# Patient Record
Sex: Male | Born: 1965 | Race: Black or African American | Hispanic: No | Marital: Single | Smoking: Never smoker
Health system: Southern US, Community
[De-identification: ages and names within clinical notes are randomized; demographics above are authoritative.]

## PROBLEM LIST (undated history)

## (undated) DIAGNOSIS — K649 Unspecified hemorrhoids: Secondary | ICD-10-CM

## (undated) DIAGNOSIS — I10 Essential (primary) hypertension: Secondary | ICD-10-CM

## (undated) HISTORY — PX: BACK SURGERY: SHX140

## (undated) HISTORY — PX: KNEE SURGERY: SHX244

---

## 2014-06-27 DIAGNOSIS — M5137 Other intervertebral disc degeneration, lumbosacral region: Secondary | ICD-10-CM | POA: Insufficient documentation

## 2014-09-22 DIAGNOSIS — I10 Essential (primary) hypertension: Secondary | ICD-10-CM | POA: Insufficient documentation

## 2014-09-22 DIAGNOSIS — E7800 Pure hypercholesterolemia, unspecified: Secondary | ICD-10-CM | POA: Insufficient documentation

## 2014-09-22 DIAGNOSIS — R7303 Prediabetes: Secondary | ICD-10-CM | POA: Insufficient documentation

## 2014-10-31 DIAGNOSIS — G8929 Other chronic pain: Secondary | ICD-10-CM | POA: Insufficient documentation

## 2014-10-31 DIAGNOSIS — M545 Low back pain: Secondary | ICD-10-CM

## 2014-11-01 DIAGNOSIS — M961 Postlaminectomy syndrome, not elsewhere classified: Secondary | ICD-10-CM | POA: Insufficient documentation

## 2014-12-05 DIAGNOSIS — M47816 Spondylosis without myelopathy or radiculopathy, lumbar region: Secondary | ICD-10-CM | POA: Insufficient documentation

## 2015-01-26 DIAGNOSIS — G4730 Sleep apnea, unspecified: Secondary | ICD-10-CM | POA: Insufficient documentation

## 2015-01-26 DIAGNOSIS — E669 Obesity, unspecified: Secondary | ICD-10-CM | POA: Insufficient documentation

## 2015-11-19 ENCOUNTER — Emergency Department
Admission: EM | Admit: 2015-11-19 | Discharge: 2015-11-19 | Disposition: A | Discharge status: Home / Self Care | Payer: Managed Care, Other (non HMO) | Attending: Emergency Medicine | Admitting: Emergency Medicine

## 2015-11-19 DIAGNOSIS — K6289 Other specified diseases of anus and rectum: Secondary | ICD-10-CM | POA: Diagnosis not present

## 2015-11-19 DIAGNOSIS — K649 Unspecified hemorrhoids: Secondary | ICD-10-CM | POA: Diagnosis not present

## 2015-11-19 LAB — CBC
HCT: 39.5 % — ABNORMAL LOW (ref 40.0–52.0)
HEMOGLOBIN: 13 g/dL (ref 13.0–18.0)
MCH: 26.7 pg (ref 26.0–34.0)
MCHC: 32.8 g/dL (ref 32.0–36.0)
MCV: 81.5 fL (ref 80.0–100.0)
PLATELETS: 275 10*3/uL (ref 150–440)
RBC: 4.85 MIL/uL (ref 4.40–5.90)
RDW: 15 % — AB (ref 11.5–14.5)
WBC: 6.2 10*3/uL (ref 3.8–10.6)

## 2015-11-19 LAB — COMPREHENSIVE METABOLIC PANEL
ALK PHOS: 83 U/L (ref 38–126)
ALT: 25 U/L (ref 17–63)
ANION GAP: 5 (ref 5–15)
AST: 18 U/L (ref 15–41)
Albumin: 4.6 g/dL (ref 3.5–5.0)
BUN: 18 mg/dL (ref 6–20)
CALCIUM: 9.2 mg/dL (ref 8.9–10.3)
CO2: 28 mmol/L (ref 22–32)
Chloride: 104 mmol/L (ref 101–111)
Creatinine, Ser: 1.17 mg/dL (ref 0.61–1.24)
GLUCOSE: 89 mg/dL (ref 65–99)
Potassium: 3.8 mmol/L (ref 3.5–5.1)
Sodium: 137 mmol/L (ref 135–145)
TOTAL PROTEIN: 7.8 g/dL (ref 6.5–8.1)
Total Bilirubin: 0.2 mg/dL — ABNORMAL LOW (ref 0.3–1.2)

## 2015-11-19 MED ORDER — DOCUSATE SODIUM 100 MG PO CAPS: 100.0000 mg | ORAL_CAPSULE | Freq: Every day | ORAL | Status: DC | PRN

## 2015-11-19 MED ORDER — OXYCODONE-ACETAMINOPHEN 5-325 MG PO TABS: 2.0000 | ORAL_TABLET | Freq: Four times a day (QID) | ORAL | Status: DC | PRN

## 2015-11-19 MED ORDER — HYDROCORTISONE ACETATE 25 MG RE SUPP: 25.0000 mg | Freq: Two times a day (BID) | RECTAL | Status: DC

## 2015-11-19 NOTE — ED Notes (Signed)
NAD noted at time of D/C. Pt denies questions or concerns. Pt ambulatory to the lobby at this time.  

## 2015-11-19 NOTE — ED Provider Notes (Signed)
[facility] Emergency Department Provider Note     Time seen: ----------------------------------------- 8:56 PM on 11/19/2015 -----------------------------------------    I have reviewed the triage vital signs and the nursing notes.   HISTORY  Chief Complaint Hemorrhoids    HPI Rodney Garrison is a 50 y.o. male who presents ER for hemorrhoidal pain for the last 5 days. Patient states she's been having rectal pain, every time he has a bowel movement he has bleeding and pain. Patient reports she is concerned because bleeding and pain is not getting any better. He's had a gastroenterologist in the past and he has a colonoscopy scheduled a month from now. He has never used any suppositories or rectal creams.   No past medical history on file.  There are no active problems to display for this patient.   No past surgical history on file.  Allergies Review of patient's allergies indicates no known allergies.  Social History Social History  Substance Use Topics  . Smoking status: Not on file  . Smokeless tobacco: Not on file  . Alcohol Use: Not on file    Review of Systems Constitutional: Negative for fever. Gastrointestinal: Negative for abdominal pain, vomiting and diarrhea. Positive for rectal pain, bleeding Skin: Negative for rash. Neurological: Negative for headaches, focal weakness or numbness.  10-point ROS otherwise negative.  ____________________________________________   PHYSICAL EXAM:  VITAL SIGNS: ED Triage Vitals  Enc Vitals Group     BP 11/19/15 1947 140/82 mmHg     Pulse Rate 11/19/15 1947 63     Resp 11/19/15 1947 18     Temp 11/19/15 1947 98.6 F (37 C)     Temp Source 11/19/15 1947 Oral     SpO2 11/19/15 1947 99 %     Weight 11/19/15 1947 265 lb (120.203 kg)     Height 11/19/15 1947  (1.93 m)     Head Cir --      Peak Flow --      Pain Score 11/19/15 1948 6     Pain Loc --      Pain Edu? --      Excl. in GC?  --    Constitutional: Alert and oriented. Well appearing and in no distress. Gastrointestinal: Soft and nontender.  Rectal: Normal external appearance, no external hemorrhoids. A visible blood. Musculoskeletal: Nontender with normal range of motion in all extremities.  Neurologic:  Normal speech and language. No gross focal neurologic deficits are appreciated.  Skin:  Skin is warm, dry and intact. No rash noted. Psychiatric: Mood and affect are normal. Speech and behavior are normal.  ____________________________________________  ED COURSE:  Pertinent labs & imaging results that were available during my care of the patient were reviewed by me and considered in my medical decision making (see chart for details). Patient is in no acute distress, is having rectal pain of uncertain etiology. ____________________________________________    LABS (pertinent positives/negatives)  Labs Reviewed  COMPREHENSIVE METABOLIC PANEL - Abnormal; Notable for the following:    Total Bilirubin 0.2 (*)    All other components within normal limits  CBC - Abnormal; Notable for the following:    HCT 39.5 (*)    RDW 15.0 (*)    All other components within normal limits  ____________________________________________  FINAL ASSESSMENT AND PLAN  Hemorrhoidal pain  Plan: Patient with labs as dictated above. Patient is likely having an intermittent prolapsed internal hemorrhoid with pain and bleeding. None is visualized at this time. He'll be placed  on Anusol suppositories, pain medicine and stool softeners. He is encouraged to have close follow-up with gastroenterology.   Emily FilbertWilliams, Jonathan E, MD   Emily FilbertJonathan E Williams, MD 11/19/15 [phone]

## 2015-11-19 NOTE — ED Notes (Signed)
Pt reports he has internal hemorrhoids and reports about 4 days ago after having a BM he developed bleeding and pain. Pt reports he is concerned because the bleeding and pain is not getting any better. Pt talking in full and complete sentence with no difficulty.

## 2015-11-19 NOTE — Discharge Instructions (Signed)
Hemorrhoids °Hemorrhoids are swollen veins around the rectum or anus. There are two types of hemorrhoids:  °· Internal hemorrhoids. These occur in the veins just inside the rectum. They may poke through to the outside and become irritated and painful. °· External hemorrhoids. These occur in the veins outside the anus and can be felt as a painful swelling or hard lump near the anus. °CAUSES °· Pregnancy.   °· Obesity.   °· Constipation or diarrhea.   °· Straining to have a bowel movement.   °· Sitting for long periods on the toilet. °· Heavy lifting or other activity that caused you to strain. °· Anal intercourse. °SYMPTOMS  °· Pain.   °· Anal itching or irritation.   °· Rectal bleeding.   °· Fecal leakage.   °· Anal swelling.   °· One or more lumps around the anus.   °DIAGNOSIS  °Your caregiver may be able to diagnose hemorrhoids by visual examination. Other examinations or tests that may be performed include:  °· Examination of the rectal area with a gloved hand (digital rectal exam).   °· Examination of anal canal using a small tube (scope).   °· A blood test if you have lost a significant amount of blood. °· A test to look inside the colon (sigmoidoscopy or colonoscopy). °TREATMENT °Most hemorrhoids can be treated at home. However, if symptoms do not seem to be getting better or if you have a lot of rectal bleeding, your caregiver may perform a procedure to help make the hemorrhoids get smaller or remove them completely. Possible treatments include:  °· Placing a rubber band at the base of the hemorrhoid to cut off the circulation (rubber band ligation).   °· Injecting a chemical to shrink the hemorrhoid (sclerotherapy).   °· Using a tool to burn the hemorrhoid (infrared light therapy).   °· Surgically removing the hemorrhoid (hemorrhoidectomy).   °· Stapling the hemorrhoid to block blood flow to the tissue (hemorrhoid stapling).   °HOME CARE INSTRUCTIONS  °· Eat foods with fiber, such as whole grains, beans,  nuts, fruits, and vegetables. Ask your doctor about taking products with added fiber in them (fiber supplements). °· Increase fluid intake. Drink enough water and fluids to keep your urine clear or pale yellow.   °· Exercise regularly.   °· Go to the bathroom when you have the urge to have a bowel movement. Do not wait.   °· Avoid straining to have bowel movements.   °· Keep the anal area dry and clean. Use wet toilet paper or moist towelettes after a bowel movement.   °· Medicated creams and suppositories may be used or applied as directed.   °· Only take over-the-counter or prescription medicines as directed by your caregiver.   °· Take warm sitz baths for 15-20 minutes, 3-4 times a day to ease pain and discomfort.   °· Place ice packs on the hemorrhoids if they are tender and swollen. Using ice packs between sitz baths may be helpful.   °· Put ice in a plastic bag.   °· Place a towel between your skin and the bag.   °· Leave the ice on for 15-20 minutes, 3-4 times a day.   °· Do not use a donut-shaped pillow or sit on the toilet for long periods. This increases blood pooling and pain.   °SEEK MEDICAL CARE IF: °· You have increasing pain and swelling that is not controlled by treatment or medicine. °· You have uncontrolled bleeding. °· You have difficulty or you are unable to have a bowel movement. °· You have pain or inflammation outside the area of the hemorrhoids. °MAKE SURE YOU: °· Understand these instructions. °·   Will watch your condition.  Will get help right away if you are not doing well or get worse.   This information is not intended to replace advice given to you by your health care provider. Make sure you discuss any questions you have with your health care provider.   Document Released: 07/26/2000 Document Revised: 07/15/2012 Document Reviewed: 06/02/2012 Elsevier Interactive Patient Education 2016 Elsevier Inc.  Proctitis Proctitis is swelling and soreness (inflammation) of the lining of  the rectum. The rectum is at the end of the large intestine, and it leads to the anus. The inflammation causes pain and discomfort. It may be a short-term (acute) or long-lasting (chronic) problem. CAUSES This condition may be caused by:  STDs (sexually transmitted diseases).  Infection.  Trauma or injury to the anus or rectum.  Ulcerative colitis or Crohn disease.  Radiation therapy that is directed near the rectum.  Antibiotic therapy. SYMPTOMS Symptoms of this condition include:  Sudden, uncomfortable, and frequent urge to have a bowel movement.  Anal pain or rectal pain.  Pain or cramping in the abdomen.  Sensation that the rectum is full.  Rectal bleeding.  Pus or mucus discharge from the anus.  Diarrhea or frequent soft, loose stools.  Constipation.  Pain with bowel movements. DIAGNOSIS This condition may be diagnosed based on:  A medical history and physical exam.  Various tests, such as:  An STD test.  Blood tests.  Stool tests.  Rectal culture.  A procedure to evaluate the anal canal (anoscopy).  Procedures to look at the entire large bowel or part of it (colonoscopy or sigmoidoscopy). TREATMENT Treatment for this condition depends on the cause. The main goals of treatment are to reduce the symptoms of inflammation and to get rid of any infection. Treatment may include:  Home remedies and lifestyle changes, such as sitz baths and avoiding food right before bedtime.  Medicines, such as:  Topical ointments, foams, suppositories, or enemas, such as corticosteroids or anti-inflammatories.  Antibiotic or antiviral medicines to treat infection or to control harmful bacteria.  Medicines to control diarrhea, soften stools, and reduce pain.  Medicines to suppress the immune system.  Nutritional, dietary, or herbal supplements.  Avoiding the activity that caused rectal trauma.  Heat or laser therapy for persistent bleeding.  A dilation  procedure to enlarge a narrowed rectum.  Surgery to repair damaged rectal lining. This is rare. If your proctitis was caused by an STD, your health care provider may test you for infection again 3 months after treatment. HOME CARE INSTRUCTIONS  Take over-the-counter and prescription medicines only as told by your health care provider.  If you were prescribed an antibiotic medicine, take it as told by your health care provider. Do not stop taking the antibiotic even if you start to feel better.  Try to avoid eating right before bedtime.  Take sitz baths as told by your health care provider.  Keep all follow-up visits as told by your health care provider. This is important. SEEK MEDICAL CARE IF:  Your symptoms do not improve with treatment.  Your symptoms get worse.  You have a fever.   This information is not intended to replace advice given to you by your health care provider. Make sure you discuss any questions you have with your health care provider.   Document Released: 07/18/2011 Document Revised: 04/19/2015 Document Reviewed: 10/24/2014 Elsevier Interactive Patient Education [employer]2016 Elsevier Inc.

## 2016-02-18 ENCOUNTER — Encounter: Payer: Self-pay | Admitting: Emergency Medicine

## 2016-02-18 ENCOUNTER — Emergency Department
Admission: EM | Admit: 2016-02-18 | Discharge: 2016-02-18 | Disposition: A | Discharge status: Home / Self Care | Payer: Managed Care, Other (non HMO) | Attending: Emergency Medicine | Admitting: Emergency Medicine

## 2016-02-18 DIAGNOSIS — I10 Essential (primary) hypertension: Secondary | ICD-10-CM | POA: Diagnosis not present

## 2016-02-18 DIAGNOSIS — K902 Blind loop syndrome, not elsewhere classified: Secondary | ICD-10-CM | POA: Insufficient documentation

## 2016-02-18 DIAGNOSIS — K644 Residual hemorrhoidal skin tags: Secondary | ICD-10-CM | POA: Insufficient documentation

## 2016-02-18 DIAGNOSIS — K602 Anal fissure, unspecified: Secondary | ICD-10-CM

## 2016-02-18 DIAGNOSIS — K6289 Other specified diseases of anus and rectum: Secondary | ICD-10-CM | POA: Diagnosis present

## 2016-02-18 HISTORY — DX: Essential (primary) hypertension: I10

## 2016-02-18 HISTORY — DX: Unspecified hemorrhoids: K64.9

## 2016-02-18 LAB — COMPREHENSIVE METABOLIC PANEL
ALBUMIN: 5.1 g/dL — AB (ref 3.5–5.0)
ALK PHOS: 92 U/L (ref 38–126)
ALT: 26 U/L (ref 17–63)
ANION GAP: 8 (ref 5–15)
AST: 19 U/L (ref 15–41)
BILIRUBIN TOTAL: 0.8 mg/dL (ref 0.3–1.2)
BUN: 13 mg/dL (ref 6–20)
CALCIUM: 9.7 mg/dL (ref 8.9–10.3)
CO2: 31 mmol/L (ref 22–32)
Chloride: 97 mmol/L — ABNORMAL LOW (ref 101–111)
Creatinine, Ser: 1.3 mg/dL — ABNORMAL HIGH (ref 0.61–1.24)
GFR calc Af Amer: >60 mL/min (ref >60)
GLUCOSE: 109 mg/dL — AB (ref 65–99)
Potassium: 3.5 mmol/L (ref 3.5–5.1)
Sodium: 136 mmol/L (ref 135–145)
TOTAL PROTEIN: 8.7 g/dL — AB (ref 6.5–8.1)

## 2016-02-18 LAB — CBC
HCT: 43.5 % (ref 40.0–52.0)
HEMOGLOBIN: 14.7 g/dL (ref 13.0–18.0)
MCH: 27.2 pg (ref 26.0–34.0)
MCHC: 33.8 g/dL (ref 32.0–36.0)
MCV: 80.3 fL (ref 80.0–100.0)
Platelets: 294 10*3/uL (ref 150–440)
RBC: 5.41 MIL/uL (ref 4.40–5.90)
RDW: 14.5 % (ref 11.5–14.5)
WBC: 7.2 10*3/uL (ref 3.8–10.6)

## 2016-02-18 MED ORDER — SENNA 8.6 MG PO TABS: 2.0000 | ORAL_TABLET | Freq: Two times a day (BID) | ORAL | Status: DC

## 2016-02-18 MED ORDER — DOCUSATE SODIUM 100 MG PO CAPS: 200.0000 mg | ORAL_CAPSULE | Freq: Two times a day (BID) | ORAL | Status: DC

## 2016-02-18 MED ORDER — PRAMOXINE HCL 1 % RE FOAM: 1.0000 "application " | Freq: Three times a day (TID) | RECTAL | Status: DC | PRN

## 2016-02-18 MED ORDER — POLYETHYLENE GLYCOL 3350 17 GM/SCOOP PO POWD: ORAL | Status: DC

## 2016-02-18 MED ORDER — TUCKS 50 % EX PADS: 1.0000 "application " | MEDICATED_PAD | CUTANEOUS | Status: DC | PRN

## 2016-02-18 NOTE — ED Notes (Signed)
Patient reports HX of hemorrhoids.  Patient reports whenever he has a BM, he has worsening pain in his rectum. The pain is constantly in his rectum x1 week

## 2016-02-18 NOTE — ED Notes (Signed)
Scheduled for colonoscopy for next Monday.

## 2016-02-18 NOTE — ED Notes (Signed)
Pt states rectal pain started 3 months ago, but increased pain started 3 days ago.  Pt states it feels like it is burning and aching in rectum.  Hard to have a BM states it feels like laser sharp pain.  Bright red stool.  Pt does have a hx of hemorrhoids in the past. Pt states it is difficulty to have BM without straining.

## 2016-02-18 NOTE — Discharge Instructions (Signed)
Anal Fissure, Adult An anal fissure is a small tear or crack in the skin around the anus. Bleeding from a fissure usually stops on its own within a few minutes. However, bleeding will often occur again with each bowel movement until the crack heals. CAUSES This condition may be caused by:  Passing large, hard stool (feces).  Frequent diarrhea.  Constipation.  Inflammatory bowel disease (Crohn disease or ulcerative colitis).  Infections.  Anal sex. SYMPTOMS Symptoms of this condition include:  Bleeding from the rectum.  Small amounts of blood seen on your stool, on toilet paper, or in the toilet after a bowel movement.  Painful bowel movements.  Itching or irritation around the anus. DIAGNOSIS A health care provider may diagnose this condition by closely examining the anal area. An anal fissure can usually be seen with careful inspection. In some cases, a rectal exam may be performed, or a short tube (anoscope) may be used to examine the anal canal. TREATMENT Treatment for this condition may include:  Taking steps to avoid constipation. This may include making changes to your diet, such as increasing your intake of fiber or fluid.  Taking fiber supplements. These supplements can soften your stool to help make bowel movements easier. Your health care provider may also prescribe a stool softener if your stool is often hard.  Taking sitz baths. This may help to heal the tear.  Using medicated creams or ointments. These may be prescribed to lessen discomfort. HOME CARE INSTRUCTIONS Eating and Drinking  Avoid foods that may be constipating, such as bananas and dairy products.  Drink enough fluid to keep your urine clear or pale yellow.  Maintain a diet that is high in fruits, whole grains, and vegetables. General Instructions  Keep the anal area as clean and dry as possible.  Take sitz baths as told by your health care provider. Do not use soap in the sitz baths.  Take  over-the-counter and prescription medicines only as told by your health care provider.  Use creams or ointments only as told by your health care provider.  Keep all follow-up visits as told by your health care provider. This is important. SEEK MEDICAL CARE IF:  You have more bleeding.  You have a fever.  You have diarrhea that is mixed with blood.  You continue to have pain.  Your problem is getting worse rather than better.   This information is not intended to replace advice given to you by your health care provider. Make sure you discuss any questions you have with your health care provider.   Document Released: 07/29/2005 Document Revised: 04/19/2015 Document Reviewed: 10/24/2014 Elsevier Interactive Patient Education 2016 [employer].  Hemorrhoids Hemorrhoids are swollen veins around the rectum or anus. There are two types of hemorrhoids:   Internal hemorrhoids. These occur in the veins just inside the rectum. They may poke through to the outside and become irritated and painful.  External hemorrhoids. These occur in the veins outside the anus and can be felt as a painful swelling or hard lump near the anus. CAUSES  Pregnancy.   Obesity.   Constipation or diarrhea.   Straining to have a bowel movement.   Sitting for long periods on the toilet.  Heavy lifting or other activity that caused you to strain.  Anal intercourse. SYMPTOMS   Pain.   Anal itching or irritation.   Rectal bleeding.   Fecal leakage.   Anal swelling.   One or more lumps around the anus.  DIAGNOSIS  Your caregiver may be able to diagnose hemorrhoids by visual examination. Other examinations or tests that may be performed include:   Examination of the rectal area with a gloved hand (digital rectal exam).   Examination of anal canal using a small tube (scope).   A blood test if you have lost a significant amount of blood.  A test to look inside the colon  (sigmoidoscopy or colonoscopy). TREATMENT Most hemorrhoids can be treated at home. However, if symptoms do not seem to be getting better or if you have a lot of rectal bleeding, your caregiver may perform a procedure to help make the hemorrhoids get smaller or remove them completely. Possible treatments include:   Placing a rubber band at the base of the hemorrhoid to cut off the circulation (rubber band ligation).   Injecting a chemical to shrink the hemorrhoid (sclerotherapy).   Using a tool to burn the hemorrhoid (infrared light therapy).   Surgically removing the hemorrhoid (hemorrhoidectomy).   Stapling the hemorrhoid to block blood flow to the tissue (hemorrhoid stapling).  HOME CARE INSTRUCTIONS   Eat foods with fiber, such as whole grains, beans, nuts, fruits, and vegetables. Ask your doctor about taking products with added fiber in them (fibersupplements).  Increase fluid intake. Drink enough water and fluids to keep your urine clear or pale yellow.   Exercise regularly.   Go to the bathroom when you have the urge to have a bowel movement. Do not wait.   Avoid straining to have bowel movements.   Keep the anal area dry and clean. Use wet toilet paper or moist towelettes after a bowel movement.   Medicated creams and suppositories may be used or applied as directed.   Only take over-the-counter or prescription medicines as directed by your caregiver.   Take warm sitz baths for 15-20 minutes, 3-4 times a day to ease pain and discomfort.   Place ice packs on the hemorrhoids if they are tender and swollen. Using ice packs between sitz baths may be helpful.   Put ice in a plastic bag.   Place a towel between your skin and the bag.   Leave the ice on for 15-20 minutes, 3-4 times a day.   Do not use a donut-shaped pillow or sit on the toilet for long periods. This increases blood pooling and pain.  SEEK MEDICAL CARE IF:  You have increasing pain and  swelling that is not controlled by treatment or medicine.  You have uncontrolled bleeding.  You have difficulty or you are unable to have a bowel movement.  You have pain or inflammation outside the area of the hemorrhoids. MAKE SURE YOU:  Understand these instructions.  Will watch your condition.  Will get help right away if you are not doing well or get worse.   This information is not intended to replace advice given to you by your health care provider. Make sure you discuss any questions you have with your health care provider.   Document Released: 07/26/2000 Document Revised: 07/15/2012 Document Reviewed: 06/02/2012 Elsevier Interactive Patient Education 2016 Elsevier Inc.  High-Fiber Diet Fiber, also called dietary fiber, is a type of carbohydrate found in fruits, vegetables, whole grains, and beans. A high-fiber diet can have many health benefits. Your health care provider may recommend a high-fiber diet to help:  Prevent constipation. Fiber can make your bowel movements more regular.  Lower your cholesterol.  Relieve hemorrhoids, uncomplicated diverticulosis, or irritable bowel syndrome.  Prevent overeating as part of a weight-loss  plan.  Prevent heart disease, type 2 diabetes, and certain cancers. WHAT IS MY PLAN? The recommended daily intake of fiber includes:  38 grams for men under age 48.  30 grams for men over age 48.  25 grams for women under age 48.  21 grams for women over age 48. You can get the recommended daily intake of dietary fiber by eating a variety of fruits, vegetables, grains, and beans. Your health care provider may also recommend a fiber supplement if it is not possible to get enough fiber through your diet. WHAT DO I NEED TO KNOW ABOUT A HIGH-FIBER DIET?  Fiber supplements have not been widely studied for their effectiveness, so it is better to get fiber through food sources.  Always check the fiber content on thenutrition facts label of  any prepackaged food. Look for foods that contain at least 5 grams of fiber per serving.  Ask your dietitian if you have questions about specific foods that are related to your condition, especially if those foods are not listed in the following section.  Increase your daily fiber consumption gradually. Increasing your intake of dietary fiber too quickly may cause bloating, cramping, or gas.  Drink plenty of water. Water helps you to digest fiber. WHAT FOODS CAN I EAT? Grains Whole-grain breads. Multigrain cereal. Oats and oatmeal. Brown rice. Barley. Bulgur wheat. Millet. Bran muffins. Popcorn. Rye wafer crackers. Vegetables Sweet potatoes. Spinach. Kale. Artichokes. Cabbage. Broccoli. Green peas. Carrots. Squash. Fruits Berries. Pears. Apples. Oranges. Avocados. Prunes and raisins. Dried figs. Meats and Other Protein Sources Navy, kidney, pinto, and soy beans. Split peas. Lentils. Nuts and seeds. Dairy Fiber-fortified yogurt. Beverages Fiber-fortified soy milk. Fiber-fortified orange juice. Other Fiber bars. The items listed above may not be a complete list of recommended foods or beverages. Contact your dietitian for more options. WHAT FOODS ARE NOT RECOMMENDED? Grains White bread. Pasta made with refined flour. White rice. Vegetables Fried potatoes. Canned vegetables. Well-cooked vegetables.  Fruits Fruit juice. Cooked, strained fruit. Meats and Other Protein Sources Fatty cuts of meat. Fried Environmental education officerpoultry or fried fish. Dairy Milk. Yogurt. Cream cheese. Sour cream. Beverages Soft drinks. Other Cakes and pastries. Butter and oils. The items listed above may not be a complete list of foods and beverages to avoid. Contact your dietitian for more information. WHAT ARE SOME TIPS FOR INCLUDING HIGH-FIBER FOODS IN MY DIET?  Eat a wide variety of high-fiber foods.  Make sure that half of all grains consumed each day are whole grains.  Replace breads and cereals made from refined  flour or white flour with whole-grain breads and cereals.  Replace white rice with brown rice, bulgur wheat, or millet.  Start the day with a breakfast that is high in fiber, such as a cereal that contains at least 5 grams of fiber per serving.  Use beans in place of meat in soups, salads, or pasta.  Eat high-fiber snacks, such as berries, raw vegetables, nuts, or popcorn.   This information is not intended to replace advice given to you by your health care provider. Make sure you discuss any questions you have with your health care provider.   Document Released: 07/29/2005 Document Revised: 08/19/2014 Document Reviewed: 01/11/2014 Elsevier Interactive Patient Education [employer] Elsevier Inc.

## 2016-02-18 NOTE — ED Provider Notes (Signed)
[facility] Emergency Department Provider Note  ____________________________________________  Time seen: 4:00 PM  I have reviewed the triage vital signs and the nursing notes.   HISTORY  Chief Complaint Rectal Pain and Nausea    HPI Rodney Garrison is a 50 y.o. male reports a history of hemorrhoids. Chronically he has had very hard stools that are difficult to pass and with a lot of straining. He's also noticed more recently that right before he passes his stool and afterward he feels like he has a sharp stabbing pain in the anus. Sometimes notices a small amount of red blood as well. No vomiting. Eating and drinking normally. Scheduled for colonoscopy in 1 week. Has tried Colace and MiraLAX which seemed to help, but he still has the pain.     Past Medical History  Diagnosis Date  . Hemorrhoid   . Hypertension      There are no active problems to display for this patient.    Past Surgical History  Procedure Laterality Date  . Back surgery    . Knee surgery      right     Current Outpatient Rx  Name  Route  Sig  Dispense  Refill  . docusate sodium (COLACE) 100 MG capsule   Oral   Take 2 capsules (200 mg total) by mouth 2 (two) times daily.   120 capsule   0   . hydrocortisone (ANUSOL-HC) 25 MG suppository   Rectal   Place 1 suppository (25 mg total) rectally 2 (two) times daily.   12 suppository   0   . polyethylene glycol powder (GLYCOLAX/MIRALAX) powder      1 cap full in a full glass of water, two times a day, for 7 days   255 g   0   . pramoxine (PROCTOFOAM) 1 % foam   Rectal   Place 1 application rectally 3 (three) times daily as needed for itching or hemorrhoids.   15 g   0   . senna (SENOKOT) 8.6 MG TABS tablet   Oral   Take 2 tablets (17.2 mg total) by mouth 2 (two) times daily.   120 each   0   . Witch Hazel (TUCKS) 50 % PADS   Apply externally   Apply 1 application topically every 2 (two) hours as needed.   40  each   2      Allergies Review of patient's allergies indicates no known allergies.   History reviewed. No pertinent family history.  Social History Social History  Substance Use Topics  . Smoking status: Never Smoker   . Smokeless tobacco: None  . Alcohol Use: No    Review of Systems  Constitutional:   No fever or chills.  ENT:   No sore throat. No rhinorrhea. Cardiovascular:   No chest pain. Respiratory:   No dyspnea or cough. Gastrointestinal:   Positive rectal pain.  Genitourinary:   Negative for dysuria or difficulty urinating. Musculoskeletal:   Negative for focal pain or swelling Neurological:   Negative for headaches 10-point ROS otherwise negative.  ____________________________________________   PHYSICAL EXAM:  VITAL SIGNS: ED Triage Vitals  Enc Vitals Group     BP 02/18/16 1430 148/87 mmHg     Pulse Rate 02/18/16 1430 97     Resp 02/18/16 1430 18     Temp 02/18/16 1430 97.9 F (36.6 C)     Temp Source 02/18/16 1430 Oral     SpO2 02/18/16 1430 99 %  Weight 02/18/16 1431 260 lb (117.935 kg)     Height 02/18/16 1431  (1.93 m)     Head Cir --      Peak Flow --      Pain Score 02/18/16 1431 9     Pain Loc --      Pain Edu? --      Excl. in GC? --     Vital signs reviewed, nursing assessments reviewed.   Constitutional:   Alert and oriented. Well appearing and in no distress. Gastrointestinal:   Soft and nontender. Non distended. Anal exam reveals a large anal fissure as well as engorged external hemorrhoids. No current bleeding. No prolapse..  Musculoskeletal:   Nontender with normal range of motion in all extremities. No joint effusions.  No lower extremity tenderness.  No edema. Neurologic:   Normal speech and language.  CN 2-10 normal. Motor grossly intact. No gross focal neurologic deficits are appreciated.  Skin:    Skin is warm, dry and intact. No rash noted.  No petechiae, purpura, or  bullae.  ____________________________________________    LABS (pertinent positives/negatives) (all labs ordered are listed, but only abnormal results are displayed) Labs Reviewed  COMPREHENSIVE METABOLIC PANEL - Abnormal; Notable for the following:    Chloride 97 (*)    Glucose, Bld 109 (*)    Creatinine, Ser 1.30 (*)    Total Protein 8.7 (*)    Albumin 5.1 (*)    All other components within normal limits  CBC   ____________________________________________   EKG    ____________________________________________    RADIOLOGY    ____________________________________________   PROCEDURES   ____________________________________________   INITIAL IMPRESSION / ASSESSMENT AND PLAN / ED COURSE  Pertinent labs & imaging results that were available during my care of the patient were reviewed by me and considered in my medical decision making (see chart for details).  Well-appearing no acute distress. Chronic hemorrhoids and now found to have anal fissure related to hard stools and straining. Counseled to do sitz baths, senna Colace and MiraLAX daily. Proctofoam and witch hazel Tucks pads. Follow-up with colonoscopy in a week.Considering the patient's symptoms, medical history, and physical examination today, I have low suspicion for cholecystitis or biliary pathology, pancreatitis, perforation or bowel obstruction, hernia, intra-abdominal abscess, AAA or dissection, volvulus or intussusception, mesenteric ischemia, or appendicitis.       ____________________________________________   FINAL CLINICAL IMPRESSION(S) / ED DIAGNOSES  Final diagnoses:  Inflamed external hemorrhoid  Anal fissure       Portions of this note were generated with dragon dictation software. Dictation errors may occur despite best attempts at proofreading.   Sharman Cheek, MD 02/18/16 [phone]

## 2016-02-26 DIAGNOSIS — K600 Acute anal fissure: Secondary | ICD-10-CM | POA: Insufficient documentation

## 2017-07-16 ENCOUNTER — Other Ambulatory Visit: Payer: Self-pay

## 2017-07-16 ENCOUNTER — Emergency Department
Admission: EM | Admit: 2017-07-16 | Discharge: 2017-07-16 | Disposition: A | Discharge status: Home / Self Care | Payer: Managed Care, Other (non HMO) | Attending: Emergency Medicine | Admitting: Emergency Medicine

## 2017-07-16 ENCOUNTER — Encounter: Payer: Self-pay | Admitting: Emergency Medicine

## 2017-07-16 ENCOUNTER — Emergency Department: Payer: Managed Care, Other (non HMO)

## 2017-07-16 DIAGNOSIS — I10 Essential (primary) hypertension: Secondary | ICD-10-CM | POA: Diagnosis not present

## 2017-07-16 DIAGNOSIS — R0602 Shortness of breath: Secondary | ICD-10-CM

## 2017-07-16 DIAGNOSIS — R5383 Other fatigue: Secondary | ICD-10-CM | POA: Diagnosis not present

## 2017-07-16 DIAGNOSIS — Z79899 Other long term (current) drug therapy: Secondary | ICD-10-CM | POA: Insufficient documentation

## 2017-07-16 LAB — CBC WITH DIFFERENTIAL/PLATELET
Basophils Absolute: 0.1 10*3/uL (ref 0–0.1)
Basophils Relative: 1 %
EOS ABS: 0.2 10*3/uL (ref 0–0.7)
Eosinophils Relative: 2 %
HEMATOCRIT: 43.1 % (ref 40.0–52.0)
HEMOGLOBIN: 14.2 g/dL (ref 13.0–18.0)
LYMPHS ABS: 1.5 10*3/uL (ref 1.0–3.6)
Lymphocytes Relative: 24 %
MCH: 26.7 pg (ref 26.0–34.0)
MCHC: 32.9 g/dL (ref 32.0–36.0)
MCV: 81.2 fL (ref 80.0–100.0)
MONOS PCT: 4 %
Monocytes Absolute: 0.3 10*3/uL (ref 0.2–1.0)
NEUTROS ABS: 4.3 10*3/uL (ref 1.4–6.5)
NEUTROS PCT: 69 %
Platelets: 293 10*3/uL (ref 150–440)
RBC: 5.31 MIL/uL (ref 4.40–5.90)
RDW: 14.3 % (ref 11.5–14.5)
WBC: 6.3 10*3/uL (ref 3.8–10.6)

## 2017-07-16 LAB — COMPREHENSIVE METABOLIC PANEL
ALBUMIN: 4.6 g/dL (ref 3.5–5.0)
ALK PHOS: 95 U/L (ref 38–126)
ALT: 32 U/L (ref 17–63)
AST: 27 U/L (ref 15–41)
Anion gap: 12 (ref 5–15)
BILIRUBIN TOTAL: 0.4 mg/dL (ref 0.3–1.2)
BUN: 14 mg/dL (ref 6–20)
CALCIUM: 9.2 mg/dL (ref 8.9–10.3)
CO2: 26 mmol/L (ref 22–32)
CREATININE: 1.23 mg/dL (ref 0.61–1.24)
Chloride: 98 mmol/L — ABNORMAL LOW (ref 101–111)
GFR calc Af Amer: >60 mL/min (ref >60)
GFR calc non Af Amer: >60 mL/min (ref >60)
GLUCOSE: 118 mg/dL — AB (ref 65–99)
Potassium: 3.7 mmol/L (ref 3.5–5.1)
SODIUM: 136 mmol/L (ref 135–145)
TOTAL PROTEIN: 8.2 g/dL — AB (ref 6.5–8.1)

## 2017-07-16 LAB — TROPONIN I: Troponin I: <0.03 ng/mL (ref <0.03)

## 2017-07-16 NOTE — Discharge Instructions (Signed)
Please seek medical attention for any high fevers, chest pain, shortness of breath, change in behavior, persistent vomiting, bloody stool or any other new or concerning symptoms.  

## 2017-07-16 NOTE — ED Provider Notes (Signed)
[facility] Emergency Department Provider Note   ____________________________________________   I have reviewed the triage vital signs and the nursing notes.   HISTORY  Chief Complaint Fatigue and Shortness of Breath   History limited by: Not Limited   HPI Rodney Garrison is a 51 y.o. male who presents to the emergency department today because of concern for fatigue and shortness of breath.  DURATION:1 month TIMING: constant SEVERITY: severe QUALITY: fatigue, heart racing CONTEXT: patient states that he has been having the symptoms for roughly 1 month. He states that they got worse last night and it woke him out of his sleep. Felt like his heart was racing.  MODIFYING FACTORS: none ASSOCIATED SYMPTOMS: denies any fever  Per medical record review patient has a history of htn.  Past Medical History:  Diagnosis Date  . Hemorrhoid   . Hypertension     There are no active problems to display for this patient.   Past Surgical History:  Procedure Laterality Date  . BACK SURGERY    . KNEE SURGERY     right    Prior to Admission medications   Medication Sig Start Date End Date Taking? Authorizing Provider  docusate sodium (COLACE) 100 MG capsule Take 2 capsules (200 mg total) by mouth 2 (two) times daily. 02/18/16   Sharman CheekStafford, Phillip, MD  hydrocortisone (ANUSOL-HC) 25 MG suppository Place 1 suppository (25 mg total) rectally 2 (two) times daily. 11/19/15   Emily FilbertWilliams, Jonathan E, MD  polyethylene glycol powder [facility](GLYCOLAX/MIRALAX) powder 1 cap full in a full glass of water, two times a day, for 7 days 02/18/16   Sharman CheekStafford, Phillip, MD  pramoxine (PROCTOFOAM) 1 % foam Place 1 application rectally 3 (three) times daily as needed for itching or hemorrhoids. 02/18/16   Sharman CheekStafford, Phillip, MD  senna (SENOKOT) 8.6 MG TABS tablet Take 2 tablets (17.2 mg total) by mouth 2 (two) times daily. 02/18/16   Sharman CheekStafford, Phillip, MD  Witch Hazel (TUCKS) 50 % PADS Apply 1 application  topically every 2 (two) hours as needed. 02/18/16   Sharman CheekStafford, Phillip, MD    Allergies Patient has no known allergies.  No family history on file.  Social History Social History   Tobacco Use  . Smoking status: Never Smoker  . Smokeless tobacco: Never Used  Substance Use Topics  . Alcohol use: No  . Drug use: Not on file    Review of Systems Constitutional: Positive for fatigue. Eyes: No visual changes. ENT: No sore throat. Cardiovascular: Positive for chest pain. Respiratory: Positive for shortness of breath. Gastrointestinal: No abdominal pain.  No nausea, no vomiting.  No diarrhea.   Genitourinary: Negative for dysuria. Musculoskeletal: Negative for back pain. Skin: Negative for rash. Neurological: Negative for headaches, focal weakness or numbness.  ____________________________________________   PHYSICAL EXAM:  VITAL SIGNS: ED Triage Vitals  Enc Vitals Group     BP 07/16/17 0859 (!) 148/82     Pulse Rate 07/16/17 0859 (!) 57     Resp 07/16/17 0859 20     Temp 07/16/17 0859 98.7 F (37.1 C)     Temp Source 07/16/17 0859 Oral     SpO2 07/16/17 0859 97 %     Weight 07/16/17 0833 260 lb (117.9 kg)     Height 07/16/17 0900 6\' 4"  (1.93 m)   Constitutional: Alert and oriented. Well appearing and in no distress. Eyes: Conjunctivae are normal.  ENT   Head: Normocephalic and atraumatic.   Nose: No congestion/rhinnorhea.   Mouth/Throat: Mucous  membranes are moist.   Neck: No stridor. Hematological/Lymphatic/Immunilogical: No cervical lymphadenopathy. Cardiovascular: Normal rate, regular rhythm.  No murmurs, rubs, or gallops.  Respiratory: Normal respiratory effort without tachypnea nor retractions. Breath sounds are clear and equal bilaterally. No wheezes/rales/rhonchi. Gastrointestinal: Soft and non tender. No rebound. No guarding.  Genitourinary: Deferred Musculoskeletal: Normal range of motion in all extremities. No lower extremity  edema. Neurologic:  Normal speech and language. No gross focal neurologic deficits are appreciated.  Skin:  Skin is warm, dry and intact. No rash noted. Psychiatric: Mood and affect are normal. Speech and behavior are normal. Patient exhibits appropriate insight and judgment.  ____________________________________________    LABS (pertinent positives/negatives)  CBC wnl CMP wnl except cl 98, glu 118, protein 8.2  ____________________________________________   EKG  I, Phineas SemenGraydon Naithan Delage, attending physician, personally viewed and interpreted this EKG  EKG Time: 0858 Rate: 54 Rhythm: sinus bradycardia Axis: normal Intervals: qtc 383 QRS: nonspecific ivcd ST changes: no st elevation Impression: abnormal ekg   ____________________________________________    RADIOLOGY  CXR No acute disease  ____________________________________________   PROCEDURES  Procedures  ____________________________________________   INITIAL IMPRESSION / ASSESSMENT AND PLAN / ED COURSE  Pertinent labs & imaging results that were available during my care of the patient were reviewed by me and considered in my medical decision making (see chart for details).  Patient presented to the emergency department today because of concerns for fatigue shortness of breath also having palpitations.  Differential would be broad including cardiac ischemia, pericardial effusion, PEs, pneumonias, arrhythmias amongst other etiologies.  Workup here without any concerning findings.  I do doubt PE.  In discussion with the patient it does sound like he has a history of obstructive sleep apnea and has been recommended CPAP.  I do think this could explain a lot of the patient's symptoms.  Discussed the findings and plan with the patient.  ____________________________________________   FINAL CLINICAL IMPRESSION(S) / ED DIAGNOSES  Final diagnoses:  Other fatigue  Shortness of breath     Note: This dictation was  prepared with Dragon dictation. Any transcriptional errors that result from this process are unintentional     Phineas SemenGoodman, Kynsleigh Westendorf, MD 07/16/17 [phone]

## 2017-07-16 NOTE — ED Triage Notes (Signed)
Pt c/o fatigue, shortness of breath going on for a mth. Pt ambulatory to stat desk with no difficulty.

## 2017-12-29 ENCOUNTER — Encounter (HOSPITAL_COMMUNITY): Payer: Self-pay | Admitting: Emergency Medicine

## 2017-12-29 ENCOUNTER — Emergency Department (HOSPITAL_COMMUNITY): Payer: Managed Care, Other (non HMO)

## 2017-12-29 ENCOUNTER — Emergency Department (HOSPITAL_COMMUNITY)
Admission: EM | Admit: 2017-12-29 | Discharge: 2017-12-29 | Disposition: A | Discharge status: Home / Self Care | Payer: Managed Care, Other (non HMO) | Attending: Emergency Medicine | Admitting: Emergency Medicine

## 2017-12-29 DIAGNOSIS — I10 Essential (primary) hypertension: Secondary | ICD-10-CM | POA: Insufficient documentation

## 2017-12-29 DIAGNOSIS — R531 Weakness: Secondary | ICD-10-CM | POA: Insufficient documentation

## 2017-12-29 DIAGNOSIS — Z79899 Other long term (current) drug therapy: Secondary | ICD-10-CM | POA: Diagnosis not present

## 2017-12-29 DIAGNOSIS — R0602 Shortness of breath: Secondary | ICD-10-CM | POA: Diagnosis present

## 2017-12-29 LAB — CBC
HEMATOCRIT: 43.1 % (ref 39.0–52.0)
Hemoglobin: 14.1 g/dL (ref 13.0–17.0)
MCH: 27.1 pg (ref 26.0–34.0)
MCHC: 32.7 g/dL (ref 30.0–36.0)
MCV: 82.7 fL (ref 78.0–100.0)
Platelets: 249 10*3/uL (ref 150–400)
RBC: 5.21 MIL/uL (ref 4.22–5.81)
RDW: 14.4 % (ref 11.5–15.5)
WBC: 6.2 10*3/uL (ref 4.0–10.5)

## 2017-12-29 LAB — COMPREHENSIVE METABOLIC PANEL
ALT: 36 U/L (ref 17–63)
ANION GAP: 9 (ref 5–15)
AST: 28 U/L (ref 15–41)
Albumin: 4.7 g/dL (ref 3.5–5.0)
Alkaline Phosphatase: 85 U/L (ref 38–126)
BUN: 13 mg/dL (ref 6–20)
CHLORIDE: 104 mmol/L (ref 101–111)
CO2: 27 mmol/L (ref 22–32)
Calcium: 9.5 mg/dL (ref 8.9–10.3)
Creatinine, Ser: 1.23 mg/dL (ref 0.61–1.24)
GFR calc Af Amer: >60 mL/min (ref >60)
Glucose, Bld: 113 mg/dL — ABNORMAL HIGH (ref 65–99)
POTASSIUM: 4 mmol/L (ref 3.5–5.1)
Sodium: 140 mmol/L (ref 135–145)
Total Bilirubin: 0.7 mg/dL (ref 0.3–1.2)
Total Protein: 8.5 g/dL — ABNORMAL HIGH (ref 6.5–8.1)

## 2017-12-29 LAB — I-STAT TROPONIN, ED: Troponin i, poc: 0 ng/mL (ref 0.00–0.08)

## 2017-12-29 LAB — LIPASE, BLOOD: LIPASE: 27 U/L (ref 11–51)

## 2017-12-29 LAB — CBG MONITORING, ED: GLUCOSE-CAPILLARY: 111 mg/dL — AB (ref 65–99)

## 2017-12-29 MED ORDER — PANTOPRAZOLE SODIUM 20 MG PO TBEC: 20.0000 mg | DELAYED_RELEASE_TABLET | Freq: Every day | ORAL | 0 refills | Status: DC

## 2017-12-29 NOTE — ED Provider Notes (Signed)
[facility] Provider Note   CSN: [id number] Arrival date & time: 12/29/17  1003     History   Chief Complaint Chief Complaint  Patient presents with  . Weakness  . Shortness of Breath    HPI Rodney Garrison is a 52 y.o. male.  HPI Patient presents to the emergency room for evaluation of shortness of breath, malaise, and fatigue.  Patient states his symptoms started a few days ago.  He feels like he is having a hard time catching his breath.  Symptoms are constant.  Nothing particularly makes it worse or better.  Patient also has generalized malaise and fatigue.  He has had a poor appetite and has not eaten much in the last few days.  He has some nausea but no vomiting.  He has had some loose stools but denies any abdominal pain.  He is not coughing.  He does not have a sore throat.  He denies any fevers.  He denies any chest pain.  he denies any history of PE or DVT.  No recent trips or travel.  He does have a history of hypertension and has been taking his medications.  He also has history of sleep apnea but has never followed up with any treatment for that Past Medical History:  Diagnosis Date  . Hemorrhoid   . Hypertension     There are no active problems to display for this patient.   Past Surgical History:  Procedure Laterality Date  . BACK SURGERY    . KNEE SURGERY     right        Home Medications    Prior to Admission medications   Medication Sig Start Date End Date Taking? Authorizing Provider  losartan-hydrochlorothiazide (HYZAAR) 100-25 MG tablet Take 1 tablet by mouth daily. 12/04/17  Yes [provider]  oxymetazoline (AFRIN) 0.05 % nasal spray Place 1 spray into both nostrils daily as needed for congestion.   Yes [provider]  Pseudoephedrine-Ibuprofen (ADVIL COLD/SINUS PO) Take 1 tablet by mouth daily.   Yes [provider]  docusate sodium (COLACE) 100 MG capsule Take 2 capsules (200 mg  total) by mouth 2 (two) times daily. Patient not taking: Reported on 12/29/2017 02/18/16   Sharman Cheek, MD  hydrocortisone (ANUSOL-HC) 25 MG suppository Place 1 suppository (25 mg total) rectally 2 (two) times daily. Patient not taking: Reported on 12/29/2017 11/19/15   Emily Filbert, MD  pantoprazole (PROTONIX) 20 MG tablet Take 1 tablet (20 mg total) by mouth daily. 12/29/17   Linwood Dibbles, MD  polyethylene glycol powder [facility]) powder 1 cap full in a full glass of water, two times a day, for 7 days Patient not taking: Reported on 12/29/2017 02/18/16   Sharman Cheek, MD  pramoxine (PROCTOFOAM) 1 % foam Place 1 application rectally 3 (three) times daily as needed for itching or hemorrhoids. Patient not taking: Reported on 12/29/2017 02/18/16   Sharman Cheek, MD  senna (SENOKOT) 8.6 MG TABS tablet Take 2 tablets (17.2 mg total) by mouth 2 (two) times daily. Patient not taking: Reported on 12/29/2017 02/18/16   Sharman Cheek, MD  Witch Hazel (TUCKS) 50 % PADS Apply 1 application topically every 2 (two) hours as needed. Patient not taking: Reported on 12/29/2017 02/18/16   Sharman Cheek, MD    Family History No family history on file.  Social History Social History   Tobacco Use  . Smoking status: Never Smoker  . Smokeless tobacco: Never Used  Substance Use  Topics  . Alcohol use: No  . Drug use: Not on file     Allergies   Patient has no known allergies.   Review of Systems Review of Systems  All other systems reviewed and are negative.    Physical Exam Updated Vital Signs BP 132/83   Pulse (!) 53   Temp 98.3 F (36.8 C) (Oral)   Resp 11   Ht 1.93 m ( )   Wt 117.9 kg (260 lb)   SpO2 99%   BMI 31.65 kg/m   Physical Exam  Constitutional: He appears well-developed and well-nourished. No distress.  HENT:  Head: Normocephalic and atraumatic.  Right Ear: External ear normal.  Left Ear: External ear normal.  Eyes: Conjunctivae are normal. Right  eye exhibits no discharge. Left eye exhibits no discharge. No scleral icterus.  Neck: Neck supple. No tracheal deviation present.  Cardiovascular: Normal rate, regular rhythm and intact distal pulses.  Pulmonary/Chest: Effort normal and breath sounds normal. No stridor. No respiratory distress. He has no wheezes. He has no rales.  Abdominal: Soft. Bowel sounds are normal. He exhibits no distension. There is no tenderness. There is no rebound and no guarding.  Musculoskeletal: He exhibits no edema or tenderness.  Neurological: He is alert. He has normal strength. No cranial nerve deficit (no facial droop, extraocular movements intact, no slurred speech) or sensory deficit. He exhibits normal muscle tone. He displays no seizure activity. Coordination normal.  Skin: Skin is warm and dry. No rash noted.  Psychiatric: He exhibits a depressed mood.  Nursing note and vitals reviewed.    ED Treatments / Results  Labs (all labs ordered are listed, but only abnormal results are displayed) Labs Reviewed  COMPREHENSIVE METABOLIC PANEL - Abnormal; Notable for the following components:      Result Value   Glucose, Bld 113 (*)    Total Protein 8.5 (*)    All other components within normal limits  CBG MONITORING, ED - Abnormal; Notable for the following components:   Glucose-Capillary 111 (*)    All other components within normal limits  CBC  LIPASE, BLOOD  I-STAT TROPONIN, ED    EKG EKG Interpretation  Date/Time:  Monday Dec 29 2017 10:32:07 EDT Ventricular Rate:  53 PR Interval:    QRS Duration: 99 QT Interval:  414 QTC Calculation: 389 R Axis:   52 Text Interpretation:  Sinus rhythm Minimal ST elevation, anterior leads No significant change since last tracing Confirmed by Benjiman Core [phone]) on 12/29/2017 10:35:00 AM   Radiology Dg Chest 2 View  Result Date: 12/29/2017 CLINICAL DATA:  Chest pain, shortness of breath, and nausea for 3 days. EXAM: CHEST - 2 VIEW COMPARISON:   07/16/2017 FINDINGS: The cardiac silhouette is borderline to mildly enlarged. No airspace consolidation, edema, pleural effusion, or pneumothorax is identified. No acute osseous abnormality is seen. IMPRESSION: No active cardiopulmonary disease. Electronically Signed   By: Sebastian Ache M.D.   On: 12/29/2017 11:39    Procedures Procedures (including critical care time)  Medications Ordered in ED Medications - No data to display   Initial Impression / Assessment and Plan / ED Course  I have reviewed the triage vital signs and the nursing notes.  Pertinent labs & imaging results that were available during my care of the patient were reviewed by me and considered in my medical decision making (see chart for details).   Patient presented to the emergency room for evaluation of fatigue, shortness of breath, and nausea for several  days.  Patient's exam is reassuring.  He is not tachycardic, hypotensive hypoxic or tachypneic.  His physical exam is reassuring.  Patient's laboratory tests are unremarkable.  No evidence of anemia.  No dehydration.  Chest x-ray does not show pneumonia or any other acute abnormalities.  I doubt the patient is having acute coronary syndrome.  I doubt pulmonary embolism.  Patient does have history of sleep apnea.  It is possible that is contributing to some of his symptoms.  Recommend he follow-up with his primary care doctor.  They may also want to consider checking his thyroid panel.  At this point, no evidence of any emergency medical condition.  Patient appears stable for outpatient evaluation  Final Clinical Impressions(s) / ED Diagnoses   Final diagnoses:  Weakness    ED Discharge Orders        Ordered    pantoprazole (PROTONIX) 20 MG tablet  Daily     12/29/17 1346       Linwood Dibbles, MD 12/29/17 1346

## 2017-12-29 NOTE — ED Notes (Signed)
Patient is asleep and resting comfortably.  

## 2017-12-29 NOTE — ED Triage Notes (Signed)
Per pt, states SOB, weak, dizzy, nausea for 3 days-states poor PO intake-states he is not sure what is going on

## 2017-12-29 NOTE — Discharge Instructions (Addendum)
Follow-up with your primary care doctor for further evaluation as we discussed

## 2017-12-29 NOTE — ED Notes (Signed)
ED Provider at bedside. 

## 2018-01-10 ENCOUNTER — Emergency Department
Admission: EM | Admit: 2018-01-10 | Discharge: 2018-01-11 | Disposition: A | Discharge status: Home / Self Care | Payer: Managed Care, Other (non HMO) | Attending: Emergency Medicine | Admitting: Emergency Medicine

## 2018-01-10 ENCOUNTER — Other Ambulatory Visit: Payer: Self-pay

## 2018-01-10 DIAGNOSIS — I10 Essential (primary) hypertension: Secondary | ICD-10-CM | POA: Diagnosis not present

## 2018-01-10 DIAGNOSIS — E860 Dehydration: Secondary | ICD-10-CM | POA: Diagnosis not present

## 2018-01-10 DIAGNOSIS — R51 Headache: Secondary | ICD-10-CM | POA: Diagnosis present

## 2018-01-10 DIAGNOSIS — R42 Dizziness and giddiness: Secondary | ICD-10-CM

## 2018-01-10 NOTE — ED Triage Notes (Addendum)
Patient reports headache and dizziness that started "couple" hours prior to arrival.  Patient described dizziness as being off balance and having difficulty walking.  Reports symptoms began at approximately 9 pm.

## 2018-01-11 ENCOUNTER — Emergency Department: Payer: Managed Care, Other (non HMO)

## 2018-01-11 LAB — BASIC METABOLIC PANEL
Anion gap: 9 (ref 5–15)
BUN: 15 mg/dL (ref 6–20)
CO2: 24 mmol/L (ref 22–32)
CREATININE: 1.21 mg/dL (ref 0.61–1.24)
Calcium: 8.7 mg/dL — ABNORMAL LOW (ref 8.9–10.3)
Chloride: 104 mmol/L (ref 101–111)
GFR calc Af Amer: >60 mL/min (ref >60)
GLUCOSE: 132 mg/dL — AB (ref 65–99)
POTASSIUM: 3.7 mmol/L (ref 3.5–5.1)
Sodium: 137 mmol/L (ref 135–145)

## 2018-01-11 LAB — TROPONIN I: Troponin I: <0.03 ng/mL (ref <0.03)

## 2018-01-11 LAB — TSH: TSH: 3.371 u[IU]/mL (ref 0.350–4.500)

## 2018-01-11 LAB — URINE DRUG SCREEN, QUALITATIVE (ARMC ONLY)
AMPHETAMINES, UR SCREEN: NOT DETECTED
BENZODIAZEPINE, UR SCRN: NOT DETECTED
Barbiturates, Ur Screen: NOT DETECTED
Cannabinoid 50 Ng, Ur ~~LOC~~: NOT DETECTED
Cocaine Metabolite,Ur ~~LOC~~: NOT DETECTED
MDMA (Ecstasy)Ur Screen: NOT DETECTED
METHADONE SCREEN, URINE: NOT DETECTED
Opiate, Ur Screen: NOT DETECTED
Phencyclidine (PCP) Ur S: NOT DETECTED
Tricyclic, Ur Screen: NOT DETECTED

## 2018-01-11 LAB — URINALYSIS, COMPLETE (UACMP) WITH MICROSCOPIC
BACTERIA UA: NONE SEEN
BILIRUBIN URINE: NEGATIVE
Glucose, UA: NEGATIVE mg/dL
Hgb urine dipstick: NEGATIVE
Ketones, ur: NEGATIVE mg/dL
Leukocytes, UA: NEGATIVE
NITRITE: NEGATIVE
PH: 7 (ref 5.0–8.0)
Protein, ur: NEGATIVE mg/dL
SPECIFIC GRAVITY, URINE: 1.011 (ref 1.005–1.030)

## 2018-01-11 LAB — CBC
HEMATOCRIT: 40 % (ref 40.0–52.0)
Hemoglobin: 13.5 g/dL (ref 13.0–18.0)
MCH: 27.4 pg (ref 26.0–34.0)
MCHC: 33.7 g/dL (ref 32.0–36.0)
MCV: 81.3 fL (ref 80.0–100.0)
Platelets: 252 10*3/uL (ref 150–440)
RBC: 4.92 MIL/uL (ref 4.40–5.90)
RDW: 14.7 % — AB (ref 11.5–14.5)
WBC: 6.6 10*3/uL (ref 3.8–10.6)

## 2018-01-11 LAB — T4, FREE: FREE T4: 0.77 ng/dL — AB (ref 0.82–1.77)

## 2018-01-11 LAB — CK: CK TOTAL: 271 U/L (ref 49–397)

## 2018-01-11 MED ORDER — SODIUM CHLORIDE 0.9 % IV BOLUS
1000.0000 mL | Freq: Once | INTRAVENOUS | Status: AC
Start: 2018-01-11 — End: 2018-01-11
  Administered 2018-01-11: 1000 mL via INTRAVENOUS

## 2018-01-11 NOTE — ED Notes (Signed)
Pt reports that he took his BP medication around 2200. Pt states that he only has a minor HA at this time, but is feeling weak.

## 2018-01-11 NOTE — ED Notes (Signed)
Pt had already urinated by the time this RN entered pt's room.

## 2018-01-11 NOTE — Discharge Instructions (Addendum)
Take your new blood pressure medicine daily as directed by your doctor. 2.  Drink plenty of fluids daily and stay indoors out of the heat. 3.  Return to the ER for worsening symptoms, persistent vomiting, lethargy or other concerns.

## 2018-01-11 NOTE — ED Provider Notes (Signed)
[facility] Emergency Department Provider Note   ____________________________________________   First MD Initiated Contact with Patient 01/11/18 0034     (approximate)  I have reviewed the triage vital signs and the nursing notes.   HISTORY  Chief Complaint Dizziness and Headache    HPI Rodney Garrison is a 52 y.o. male who presents to the ED from home with a chief complaint of headache and dizziness.  Patient reports his PCP took him off losartan due to the recent recall.  As a result, he had not had any antihypertensives for the past week.  He was able to pick up his new prescription of amlodipine-benazepril today.  Patient reports he is watching TV approximately 10 PM when he noted a mild, gradual and dull frontal headache and dizziness which felt like a combination of movement and lightheadedness.  Patient took his blood pressure pill approximately 10:20 PM and is already feeling better.  Reports dizziness worsened with head movements.  Denies associated vision changes, neck pain, chest pain, shortness of breath, abdominal pain, nausea or vomiting.  Denies recent travel or trauma.  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   Past Medical History:  Diagnosis Date  . Hemorrhoid   . Hypertension     There are no active problems to display for this patient.   Past Surgical History:  Procedure Laterality Date  . BACK SURGERY    . KNEE SURGERY     right    Prior to Admission medications   Medication Sig Start Date End Date Taking? Authorizing Provider  amLODipine-benazepril (LOTREL) 5-10 MG capsule Take 1 capsule by mouth daily. 12/30/17  Yes [provider]  docusate sodium (COLACE) 100 MG capsule Take 2 capsules (200 mg total) by mouth 2 (two) times daily. Patient not taking: Reported on 12/29/2017 02/18/16   Sharman Cheek, MD  hydrocortisone (ANUSOL-HC) 25 MG suppository Place 1 suppository (25 mg  total) rectally 2 (two) times daily. Patient not taking: Reported on 12/29/2017 11/19/15   Emily Filbert, MD  pantoprazole (PROTONIX) 20 MG tablet Take 1 tablet (20 mg total) by mouth daily. Patient not taking: Reported on 01/11/2018 12/29/17   Linwood Dibbles, MD  polyethylene glycol powder [facility]) powder 1 cap full in a full glass of water, two times a day, for 7 days Patient not taking: Reported on 12/29/2017 02/18/16   Sharman Cheek, MD  pramoxine (PROCTOFOAM) 1 % foam Place 1 application rectally 3 (three) times daily as needed for itching or hemorrhoids. Patient not taking: Reported on 12/29/2017 02/18/16   Sharman Cheek, MD  senna (SENOKOT) 8.6 MG TABS tablet Take 2 tablets (17.2 mg total) by mouth 2 (two) times daily. Patient not taking: Reported on 12/29/2017 02/18/16   Sharman Cheek, MD  Witch Hazel (TUCKS) 50 % PADS Apply 1 application topically every 2 (two) hours as needed. Patient not taking: Reported on 12/29/2017 02/18/16   Sharman Cheek, MD    Allergies Patient has no known allergies.  No family history on file.  Social History Social History   Tobacco Use  . Smoking status: Never Smoker  . Smokeless tobacco: Never Used  Substance Use Topics  . Alcohol use: No  . Drug use: Not on file    Review of Systems  Constitutional: No fever/chills Eyes: No visual changes. ENT: No sore throat. Cardiovascular: Denies chest pain. Respiratory: Denies shortness of breath. Gastrointestinal: No abdominal pain.  No nausea, no vomiting.  No  diarrhea.  No constipation. Genitourinary: Negative for dysuria. Musculoskeletal: Negative for back pain. Skin: Negative for rash. Neurological: Positive for headache and dizziness.  Negative for focal weakness or numbness.   ____________________________________________   PHYSICAL EXAM:  VITAL SIGNS: ED Triage Vitals [01/10/18 2354]  Enc Vitals Group     BP (!) 173/109     Pulse Rate 89     Resp 16     Temp 98.2 F  (36.8 C)     Temp Source Oral     SpO2 99 %     Weight      Height      Head Circumference      Peak Flow      Pain Score      Pain Loc      Pain Edu?      Excl. in GC?     Constitutional: Alert and oriented. Well appearing and in no acute distress. Eyes: Conjunctivae are normal. PERRL. EOMI. Head: Atraumatic. Nose: No congestion/rhinnorhea. Mouth/Throat: Mucous membranes are moist.  Oropharynx non-erythematous. Neck: No stridor.  No carotid bruits.  Supple neck without meningismus. Cardiovascular: Normal rate, regular rhythm. Grossly normal heart sounds.  Good peripheral circulation. Respiratory: Normal respiratory effort.  No retractions. Lungs CTAB. Gastrointestinal: Soft and nontender. No distention. No abdominal bruits. No CVA tenderness. Musculoskeletal: No lower extremity tenderness nor edema.  No joint effusions. Neurologic: Alert and oriented x3.  Normal speech and language. No gross focal neurologic deficits are appreciated. MAEx4. No gait instability. Skin:  Skin is warm, dry and intact. No rash noted. Psychiatric: Mood and affect are normal. Speech and behavior are normal.  ____________________________________________   LABS (all labs ordered are listed, but only abnormal results are displayed)  Labs Reviewed  BASIC METABOLIC PANEL - Abnormal; Notable for the following components:      Result Value   Glucose, Bld 132 (*)    Calcium 8.7 (*)    All other components within normal limits  CBC - Abnormal; Notable for the following components:   RDW 14.7 (*)    All other components within normal limits  URINALYSIS, COMPLETE (UACMP) WITH MICROSCOPIC - Abnormal; Notable for the following components:   Color, Urine STRAW (*)    APPearance CLEAR (*)    All other components within normal limits  T4, FREE - Abnormal; Notable for the following components:   Free T4 0.77 (*)    All other components within normal limits  TROPONIN I  TSH  URINE DRUG SCREEN, QUALITATIVE  ([facility] ONLY)  CK   ____________________________________________  EKG  ED ECG REPORT I, SUNG,JADE J, the attending physician, personally viewed and interpreted this ECG.   Date: 01/11/2018  EKG Time: 2352  Rate: 72  Rhythm: normal EKG, normal sinus rhythm  Axis: Normal  Intervals:none  ST&T Change: Nonspecific  ____________________________________________  RADIOLOGY  ED MD interpretation: No ICH  Official radiology report(s): Ct Head Wo Contrast  Result Date: 01/11/2018 CLINICAL DATA:  Acute onset of headache and dizziness. EXAM: CT HEAD WITHOUT CONTRAST TECHNIQUE: Contiguous axial images were obtained from the base of the skull through the vertex without intravenous contrast. COMPARISON:  None. FINDINGS: Brain: No evidence of acute infarction, hemorrhage, hydrocephalus, extra-axial collection or mass lesion/mass effect. The posterior fossa, including the cerebellum, brainstem and fourth ventricle, is within normal limits. The third and lateral ventricles, and basal ganglia are unremarkable in appearance. The cerebral hemispheres are symmetric in appearance, with normal gray-white differentiation. No mass effect or midline shift is  seen. Vascular: No hyperdense vessel or unexpected calcification. Skull: There is no evidence of fracture; visualized osseous structures are unremarkable in appearance. Sinuses/Orbits: The orbits are within normal limits. The paranasal sinuses and mastoid air cells are well-aerated. Other: No significant soft tissue abnormalities are seen. IMPRESSION: Unremarkable noncontrast CT of the head. Electronically Signed   By: Roanna Raider M.D.   On: 01/11/2018 00:50    ____________________________________________   PROCEDURES  Procedure(s) performed: None  Procedures  Critical Care performed: No  ____________________________________________   INITIAL IMPRESSION / ASSESSMENT AND PLAN / ED COURSE  As part of my medical decision making, I reviewed the  following data within the electronic MEDICAL RECORD NUMBER Nursing notes reviewed and incorporated, Labs reviewed, EKG interpreted, Old chart reviewed and Notes from prior ED visits   52 year old male with hypertension who presents with mild frontal headache and dizziness.  Differential diagnosis includes, but is not limited to, intracranial hemorrhage, meningitis/encephalitis, previous head trauma, cavernous venous thrombosis, tension headache, temporal arteritis, migraine or migraine equivalent, idiopathic intracranial hypertension, and non-specific headache.   Blood pressure improved to 140s/80s; patient took BP pill prior to arrival.  Mild orthostasis noted.  I personally reviewed patient's last ED visit on 12/29/2017 at [facility] for a chief complaint of weakness.  It was noted on the chart that patient should consider having his thyroid function checked with his doctor.  I will add these to the labs tonight.  We will give IV fluids for orthostasis.  Patient states he is already feeling significantly better.   Clinical Course as of Jan 12 240  Sun Jan 11, 2018  0239 Patient resting no acute distress.  Updated him on all test results.  Patient will follow-up with his PCP next week.  Strict return precautions given.  Patient verbalizes understanding and agrees with plan of care.   [JS]    Clinical Course User Index [JS] Irean Hong, MD     ____________________________________________   FINAL CLINICAL IMPRESSION(S) / ED DIAGNOSES  Final diagnoses:  Dehydration  Dizziness  Essential hypertension     ED Discharge Orders    None       Note:  This document was prepared using Dragon voice recognition software and may include unintentional dictation errors.    Irean Hong, MD 01/11/18 0600

## 2018-01-13 ENCOUNTER — Observation Stay
Admission: EM | Admit: 2018-01-13 | Discharge: 2018-01-15 | Disposition: A | Discharge status: Home / Self Care | Payer: Managed Care, Other (non HMO) | Attending: Internal Medicine | Admitting: Internal Medicine

## 2018-01-13 ENCOUNTER — Emergency Department: Payer: Managed Care, Other (non HMO)

## 2018-01-13 ENCOUNTER — Other Ambulatory Visit: Payer: Self-pay

## 2018-01-13 DIAGNOSIS — R0789 Other chest pain: Secondary | ICD-10-CM | POA: Diagnosis not present

## 2018-01-13 DIAGNOSIS — R079 Chest pain, unspecified: Secondary | ICD-10-CM | POA: Diagnosis present

## 2018-01-13 DIAGNOSIS — R531 Weakness: Secondary | ICD-10-CM | POA: Insufficient documentation

## 2018-01-13 DIAGNOSIS — K219 Gastro-esophageal reflux disease without esophagitis: Secondary | ICD-10-CM | POA: Diagnosis not present

## 2018-01-13 DIAGNOSIS — E669 Obesity, unspecified: Secondary | ICD-10-CM | POA: Diagnosis not present

## 2018-01-13 DIAGNOSIS — E785 Hyperlipidemia, unspecified: Secondary | ICD-10-CM | POA: Insufficient documentation

## 2018-01-13 DIAGNOSIS — Z6833 Body mass index (BMI) 33.0-33.9, adult: Secondary | ICD-10-CM | POA: Insufficient documentation

## 2018-01-13 DIAGNOSIS — I200 Unstable angina: Secondary | ICD-10-CM

## 2018-01-13 DIAGNOSIS — I10 Essential (primary) hypertension: Secondary | ICD-10-CM | POA: Insufficient documentation

## 2018-01-13 DIAGNOSIS — G4730 Sleep apnea, unspecified: Secondary | ICD-10-CM | POA: Diagnosis not present

## 2018-01-13 LAB — CBC
HEMATOCRIT: 43 % (ref 40.0–52.0)
HEMOGLOBIN: 14.4 g/dL (ref 13.0–18.0)
MCH: 27.1 pg (ref 26.0–34.0)
MCHC: 33.4 g/dL (ref 32.0–36.0)
MCV: 81.2 fL (ref 80.0–100.0)
Platelets: 271 10*3/uL (ref 150–440)
RBC: 5.29 MIL/uL (ref 4.40–5.90)
RDW: 14.8 % — ABNORMAL HIGH (ref 11.5–14.5)
WBC: 7.2 10*3/uL (ref 3.8–10.6)

## 2018-01-13 LAB — CBC WITH DIFFERENTIAL/PLATELET
Basophils Absolute: 0 10*3/uL (ref 0–0.1)
Basophils Relative: 1 %
EOS ABS: 0.1 10*3/uL (ref 0–0.7)
Eosinophils Relative: 1 %
HEMATOCRIT: 42.5 % (ref 40.0–52.0)
Hemoglobin: 14.2 g/dL (ref 13.0–18.0)
LYMPHS ABS: 1.4 10*3/uL (ref 1.0–3.6)
Lymphocytes Relative: 18 %
MCH: 27.2 pg (ref 26.0–34.0)
MCHC: 33.5 g/dL (ref 32.0–36.0)
MCV: 81.3 fL (ref 80.0–100.0)
MONO ABS: 0.3 10*3/uL (ref 0.2–1.0)
MONOS PCT: 4 %
Neutro Abs: 5.6 10*3/uL (ref 1.4–6.5)
Neutrophils Relative %: 76 %
Platelets: 286 10*3/uL (ref 150–440)
RBC: 5.23 MIL/uL (ref 4.40–5.90)
RDW: 14.9 % — AB (ref 11.5–14.5)
WBC: 7.4 10*3/uL (ref 3.8–10.6)

## 2018-01-13 LAB — COMPREHENSIVE METABOLIC PANEL
ALBUMIN: 4.8 g/dL (ref 3.5–5.0)
ALT: 25 U/L (ref 17–63)
AST: 20 U/L (ref 15–41)
Alkaline Phosphatase: 83 U/L (ref 38–126)
Anion gap: 10 (ref 5–15)
BUN: 14 mg/dL (ref 6–20)
CHLORIDE: 100 mmol/L — AB (ref 101–111)
CO2: 25 mmol/L (ref 22–32)
CREATININE: 1.26 mg/dL — AB (ref 0.61–1.24)
Calcium: 9.4 mg/dL (ref 8.9–10.3)
GFR calc Af Amer: >60 mL/min (ref >60)
GFR calc non Af Amer: >60 mL/min (ref >60)
GLUCOSE: 105 mg/dL — AB (ref 65–99)
Potassium: 3.9 mmol/L (ref 3.5–5.1)
SODIUM: 135 mmol/L (ref 135–145)
Total Bilirubin: 0.6 mg/dL (ref 0.3–1.2)
Total Protein: 8.6 g/dL — ABNORMAL HIGH (ref 6.5–8.1)

## 2018-01-13 LAB — TROPONIN I
Troponin I: <0.03 ng/mL (ref <0.03)
Troponin I: <0.03 ng/mL (ref <0.03)
Troponin I: <0.03 ng/mL (ref <0.03)

## 2018-01-13 LAB — CREATININE, SERUM
Creatinine, Ser: 1.2 mg/dL (ref 0.61–1.24)
GFR calc Af Amer: >60 mL/min (ref >60)
GFR calc non Af Amer: >60 mL/min (ref >60)

## 2018-01-13 LAB — FIBRIN DERIVATIVES D-DIMER (ARMC ONLY): Fibrin derivatives D-dimer (ARMC): 311.23 ng/mL (FEU) (ref 0.00–499.00)

## 2018-01-13 LAB — BRAIN NATRIURETIC PEPTIDE: B Natriuretic Peptide: 26 pg/mL (ref 0.0–100.0)

## 2018-01-13 MED ORDER — NITROGLYCERIN 0.4 MG SL SUBL: 0.4000 mg | SUBLINGUAL_TABLET | SUBLINGUAL | Status: DC | PRN

## 2018-01-13 MED ORDER — ASPIRIN EC 325 MG PO TBEC
325.0000 mg | DELAYED_RELEASE_TABLET | Freq: Every day | ORAL | Status: DC
  Administered 2018-01-14 – 2018-01-15 (×2): 325 mg via ORAL
  Filled 2018-01-13 (×2): qty 1

## 2018-01-13 MED ORDER — ALPRAZOLAM 0.25 MG PO TABS: 0.2500 mg | ORAL_TABLET | Freq: Two times a day (BID) | ORAL | Status: DC | PRN

## 2018-01-13 MED ORDER — IPRATROPIUM-ALBUTEROL 0.5-2.5 (3) MG/3ML IN SOLN: 3.0000 mL | RESPIRATORY_TRACT | Status: DC | PRN

## 2018-01-13 MED ORDER — GI COCKTAIL ~~LOC~~
30.0000 mL | Freq: Four times a day (QID) | ORAL | Status: DC | PRN
  Filled 2018-01-13: qty 30

## 2018-01-13 MED ORDER — ENOXAPARIN SODIUM 40 MG/0.4ML ~~LOC~~ SOLN
40.0000 mg | SUBCUTANEOUS | Status: DC
  Filled 2018-01-13: qty 0.4

## 2018-01-13 MED ORDER — METOPROLOL TARTRATE 25 MG PO TABS
25.0000 mg | ORAL_TABLET | Freq: Two times a day (BID) | ORAL | Status: DC
  Filled 2018-01-13 (×2): qty 1

## 2018-01-13 MED ORDER — FAMOTIDINE IN NACL 20-0.9 MG/50ML-% IV SOLN
20.0000 mg | Freq: Two times a day (BID) | INTRAVENOUS | Status: DC
  Administered 2018-01-14 (×2): 20 mg via INTRAVENOUS
  Filled 2018-01-13 (×3): qty 50

## 2018-01-13 MED ORDER — ACETAMINOPHEN 325 MG PO TABS: 650.0000 mg | ORAL_TABLET | ORAL | Status: DC | PRN

## 2018-01-13 MED ORDER — ALBUTEROL SULFATE (2.5 MG/3ML) 0.083% IN NEBU
2.5000 mg | INHALATION_SOLUTION | Freq: Once | RESPIRATORY_TRACT | Status: AC
  Administered 2018-01-13: 2.5 mg via RESPIRATORY_TRACT
  Filled 2018-01-13: qty 3

## 2018-01-13 MED ORDER — ONDANSETRON HCL 4 MG/2ML IJ SOLN: 4.0000 mg | Freq: Four times a day (QID) | INTRAMUSCULAR | Status: DC | PRN

## 2018-01-13 MED ORDER — MORPHINE SULFATE (PF) 2 MG/ML IV SOLN: 2.0000 mg | INTRAVENOUS | Status: DC | PRN

## 2018-01-13 MED ORDER — ZOLPIDEM TARTRATE 5 MG PO TABS: 5.0000 mg | ORAL_TABLET | Freq: Every evening | ORAL | Status: DC | PRN

## 2018-01-13 NOTE — Progress Notes (Signed)
Family Meeting Note  Advance Directive:yes  Today a meeting took place with the Patient.  Patient is able to participate   The following clinical team members were present during this meeting:MD  The following were discussed:Patient's diagnosis: Hypertension, chronic chest pain, hemorrhoid disease, obesity, chronic pain syndrome, hyperlipidemia, Patient's progosis: Unable to determine and Goals for treatment: Full Code  Additional follow-up to be provided: prn  Time spent during discussion:20 minutes  Rodney SolMontell D Lennox Leikam, MD

## 2018-01-13 NOTE — ED Notes (Signed)
Md MayfairMalinda at bedside

## 2018-01-13 NOTE — H&P (Signed)
[employer] - [place] [facility]   PATIENT NAME: Rodney Garrison    MR#:  [id number]  DATE OF BIRTH:  09-Mar-1966  DATE OF ADMISSION:  01/13/2018  PRIMARY CARE PHYSICIAN: System, Pcp Not In   REQUESTING/REFERRING PHYSICIAN:   CHIEF COMPLAINT:   Chief Complaint  Patient presents with  . Chest Pain    HISTORY OF PRESENT ILLNESS: Rodney Garrison  is a 52 y.o. male with a known history per below which also includes obesity, chronic low back pain, hyperlipidemia, presenting with acute on chronic chest pain which he has had over 1- 2 years intermittently, had exercise stress test done at [facility] in December 2018-results unknown, presents today with 1 to 2-week history of intermittent continued chest pain described as a sharp pain located in his mid chest associated with shortness of breath, diaphoresis, lightheadedness, dizziness, worse with deep breathing, patient also describes it as a chest tightness whereby he cannot catch his breath, in the emergency room work-up was unimpressive, patient evaluated in the emergency room, no apparent distress, resting comfortably in bed, patient is now been admitted for acute on chronic intermittent atypical chest pain secondary to unknown etiology.  PAST MEDICAL HISTORY:   Past Medical History:  Diagnosis Date  . Hemorrhoid   . Hypertension     PAST SURGICAL HISTORY:  Past Surgical History:  Procedure Laterality Date  . BACK SURGERY    . KNEE SURGERY     right    SOCIAL HISTORY:  Social History   Tobacco Use  . Smoking status: Never Smoker  . Smokeless tobacco: Never Used  Substance Use Topics  . Alcohol use: No    FAMILY HISTORY:  HTN  DRUG ALLERGIES:  NKDA  REVIEW OF SYSTEMS:   CONSTITUTIONAL: No fever, +fatigue, weakness.  EYES: No blurred or double vision.  EARS, NOSE, AND THROAT: No tinnitus or ear pain.  RESPIRATORY: No cough, +shortness of breath, no wheezing or hemoptysis.  CARDIOVASCULAR: + chest  pain,no orthopnea, edema.  GASTROINTESTINAL: No nausea, vomiting, diarrhea or abdominal pain.  GENITOURINARY: No dysuria, hematuria.  ENDOCRINE: No polyuria, nocturia,  HEMATOLOGY: No anemia, easy bruising or bleeding SKIN: No rash or lesion. MUSCULOSKELETAL: No joint pain or arthritis.   NEUROLOGIC: No tingling, numbness, weakness.  PSYCHIATRY: No anxiety or depression.   MEDICATIONS AT HOME:  Prior to Admission medications   Not on File      PHYSICAL EXAMINATION:   VITAL SIGNS: Blood pressure (!) 145/85, pulse (!) 55, temperature 98.7 F (37.1 C), temperature source Oral, resp. rate 12, height 6\' 4"  (1.93 m), weight 117.9 kg (260 lb), SpO2 98 %.  GENERAL:  52 y.o.-year-old patient lying in the bed with no acute distress.  Obese, nontoxic-appearing EYES: Pupils equal, round, reactive to light and accommodation. No scleral icterus. Extraocular muscles intact.  HEENT: Head atraumatic, normocephalic. Oropharynx and nasopharynx clear.  NECK:  Supple, no jugular venous distention. No thyroid enlargement, no tenderness.  LUNGS: Normal breath sounds bilaterally, no wheezing, rales,rhonchi or crepitation. No use of accessory muscles of respiration.  CARDIOVASCULAR: S1, S2 normal. No murmurs, rubs, or gallops.  ABDOMEN: Soft, nontender, nondistended. Bowel sounds present. No organomegaly or mass.  EXTREMITIES: No pedal edema, cyanosis, or clubbing.  NEUROLOGIC: Cranial nerves II through XII are intact. MAES. Gait not checked.  PSYCHIATRIC: The patient is alert and oriented x 3.  SKIN: No obvious rash, lesion, or ulcer.   LABORATORY PANEL:   CBC Recent Labs  Lab 01/11/18 0015 01/13/18 1239  WBC 6.6 7.4  HGB 13.5 14.2  HCT 40.0 42.5  PLT 252 286  MCV 81.3 81.3  MCH 27.4 27.2  MCHC 33.7 33.5  RDW 14.7* 14.9*  LYMPHSABS  --  1.4  MONOABS  --  0.3  EOSABS  --  0.1  BASOSABS  --  0.0    ------------------------------------------------------------------------------------------------------------------  Chemistries  Recent Labs  Lab 01/11/18 0015 01/13/18 1239  NA 137 135  K 3.7 3.9  CL 104 100*  CO2 24 25  GLUCOSE 132* 105*  BUN 15 14  CREATININE 1.21 1.26*  CALCIUM 8.7* 9.4  AST  --  20  ALT  --  25  ALKPHOS  --  83  BILITOT  --  0.6   ------------------------------------------------------------------------------------------------------------------ estimated creatinine clearance is 98.4 mL/min (A) (by C-G formula based on SCr of 1.26 mg/dL (H)). ------------------------------------------------------------------------------------------------------------------ Recent Labs    01/11/18 0015  TSH 3.371     Coagulation profile No results for input(s): INR, PROTIME in the last 168 hours. ------------------------------------------------------------------------------------------------------------------- No results for input(s): DDIMER in the last 72 hours. -------------------------------------------------------------------------------------------------------------------  Cardiac Enzymes Recent Labs  Lab 01/11/18 0015 01/13/18 1239 01/13/18 1508  TROPONINI <0.03 <0.03 <0.03   ------------------------------------------------------------------------------------------------------------------ Invalid input(s): POCBNP  ---------------------------------------------------------------------------------------------------------------  Urinalysis    Component Value Date/Time   COLORURINE STRAW (A) 01/11/2018 0015   APPEARANCEUR CLEAR (A) 01/11/2018 0015   LABSPEC 1.011 01/11/2018 0015   PHURINE 7.0 01/11/2018 0015   GLUCOSEU NEGATIVE 01/11/2018 0015   HGBUR NEGATIVE 01/11/2018 0015   BILIRUBINUR NEGATIVE 01/11/2018 0015   KETONESUR NEGATIVE 01/11/2018 0015   PROTEINUR NEGATIVE 01/11/2018 0015   NITRITE NEGATIVE 01/11/2018 0015   LEUKOCYTESUR NEGATIVE  01/11/2018 0015     RADIOLOGY: Dg Chest Portable 1 View  Result Date: 01/13/2018 CLINICAL DATA:  Chest pain for several days EXAM: PORTABLE CHEST 1 VIEW COMPARISON:  12/29/2017 FINDINGS: Cardiac shadow is stable. The lungs are well aerated bilaterally. No focal infiltrate or sizable effusion is seen. No bony abnormality is noted. IMPRESSION: No active disease. Electronically Signed   By: Alcide CleverMark  Lukens M.D.   On: 01/13/2018 13:11    EKG: Orders placed or performed during the hospital encounter of 01/13/18  . ED EKG  . ED EKG  . EKG 12-Lead  . EKG 12-Lead    IMPRESSION AND PLAN: *Acute on chronic intermittent chest pain 1-2 years in duration, [facility] December 2018-results unknown via care everywhere Referred to the observation unit, cycle cardiac enzymes, consult cardiology for expert opinion, proceed with nuclear medicine stress testing in the morning, [facility] chart, aspirin daily, beta-blocker therapy, check lipids in the morning, nitrates as needed, IV morphine PRN breakthrough pain, supplemental oxygen, continue close medical monitoring  *Chronic benign essential hypertension Stable Lopressor, vitals per routine, make changes as per necessary  *Chronic hyperlipidemia, unspecified Check lipids in the morning  *Chronic obesity Lifestyle modification recommended   All the records are reviewed and case discussed with ED provider. Management plans discussed with the patient, family and they are in agreement.  CODE STATUS:full    TOTAL TIME TAKING CARE OF THIS PATIENT: 45 minutes.    Evelena AsaMontell D Salary M.D on 01/13/2018   Between 7am to 6pm - Pager - [phone]  After 6pm go to [email or web address] - password [email or web address]  [facility]  Office  [phone]  CC: Primary care physician; System, Pcp Not In   Note: This dictation was prepared with Dragon dictation along with smaller  Lobbyist. Any  transcriptional errors that result from this process are unintentional.

## 2018-01-13 NOTE — ED Provider Notes (Signed)
[facility] Emergency Department Provider Note   ____________________________________________   First MD Initiated Contact with Patient 01/13/18 1233     (approximate)  I have reviewed the triage vital signs and the nursing notes.   HISTORY  Chief Complaint Chest Pain   HPI Rodney Garrison is a 52 y.o. male Reports a month of progressive shortness of breath and some chest tightness worse with exertion. He was here Saturday when he ran out of his medicine due to med recall started on a new medication for his blood pressure Norvasc patient reports after he takes Norvasc he feels very bad for about 8 hours his heart race races he is short of breath lightheaded etc.today when he got up he went for a walk and felt short of breath he took a shower and got lightheaded and short of breath got worse he also then had some chest tightness. He came into the emergency room. EMS EKG looked like it might be some ST elevation in the 23 and 4 however EKG here looked very similar to one from the first of the month. Patient's chest tightness 1 out of 10.   Past Medical History:  Diagnosis Date  . Hemorrhoid   . Hypertension     Patient Active Problem List   Diagnosis Date Noted  . Chest pain 01/13/2018    Past Surgical History:  Procedure Laterality Date  . BACK SURGERY    . KNEE SURGERY     right    Prior to Admission medications   Not on File    Allergies Patient has no known allergies.  History reviewed. No pertinent family history.  Social History Social History   Tobacco Use  . Smoking status: Never Smoker  . Smokeless tobacco: Never Used  Substance Use Topics  . Alcohol use: No  . Drug use: Not on file    Review of Systems  Constitutional: No fever/chills Eyes: No visual changes. ENT: No sore throat. Cardiovascular:  chest pain. Respiratory:  shortness of breath. Gastrointestinal: No abdominal pain.  No nausea, no vomiting.  No diarrhea.  No  constipation. Genitourinary: Negative for dysuria. Musculoskeletal: Negative for back pain. Skin: Negative for rash. Neurological: Negative for headaches, focal weakness  ____________________________________________   PHYSICAL EXAM:  VITAL SIGNS: ED Triage Vitals  Enc Vitals Group     BP --      Pulse --      Resp --      Temp --      Temp src --      SpO2 --      Weight 01/13/18 1237 260 lb (117.9 kg)     Height 01/13/18 1237 6\' 4"  (1.93 m)     Head Circumference --      Peak Flow --      Pain Score 01/13/18 1236 2     Pain Loc --      Pain Edu? --      Excl. in GC? --     Constitutional: Alert and oriented. Well appearing and in no acute distress. Eyes: Conjunctivae are normal. Head: Atraumatic. Nose: No congestion/rhinnorhea. Mouth/Throat: Mucous membranes are moist.  Oropharynx non-erythematous. Neck: No stridor.   Cardiovascular: Normal rate, regular rhythm. Grossly normal heart sounds.  Good peripheral circulation. Respiratory: Normal respiratory effort.  No retractions. Lungs CTAB. Gastrointestinal: Soft and nontender. No distention. No abdominal bruits. No CVA tenderness. }Musculoskeletal: No lower extremity tenderness nor edema.  No joint effusions. Neurologic:  Normal speech and  language. No gross focal neurologic deficits are appreciated. Skin:  Skin is warm, dry and intact. No rash noted. Psychiatric: Mood and affect are normal. Speech and behavior are normal.  ____________________________________________   LABS (all labs ordered are listed, but only abnormal results are displayed)  Labs Reviewed  COMPREHENSIVE METABOLIC PANEL - Abnormal; Notable for the following components:      Result Value   Chloride 100 (*)    Glucose, Bld 105 (*)    Creatinine, Ser 1.26 (*)    Total Protein 8.6 (*)    All other components within normal limits  CBC WITH DIFFERENTIAL/PLATELET - Abnormal; Notable for the following components:   RDW 14.9 (*)    All other  components within normal limits  TROPONIN I  BRAIN NATRIURETIC PEPTIDE  FIBRIN DERIVATIVES D-DIMER ([facility] ONLY)  TROPONIN I   ____________________________________________  EKG  EKG read and interpreted by me shows sinus bradycardia at a rate of 56 normal axis and slight ST elevation in the precordial leads 1 mm in V3 computer feels it may be early repolarization ED complexes look like early re-pole-type complexes ____________________________________________  RADIOLOGY  ED MD interpretation:  chest x-ray shows no acute disease  Official radiology report(s): Dg Chest Portable 1 View  Result Date: 01/13/2018 CLINICAL DATA:  Chest pain for several days EXAM: PORTABLE CHEST 1 VIEW COMPARISON:  12/29/2017 FINDINGS: Cardiac shadow is stable. The lungs are well aerated bilaterally. No focal infiltrate or sizable effusion is seen. No bony abnormality is noted. IMPRESSION: No active disease. Electronically Signed   By: Alcide CleverMark  Lukens M.D.   On: 01/13/2018 13:11    ____________________________________________   PROCEDURES  Procedure(s) performed:   ED EKG Date/Time: 01/13/2018 4:36 PM Performed by: Arnaldo NatalMalinda, Coralie Stanke F, MD Authorized by: Irean HongSung, Jade J, MD      Critical Care performed:   ____________________________________________   INITIAL IMPRESSION / ASSESSMENT AND PLAN / ED COURSE  patient reports he is having more worse chest pain shortness of breath exertion. Even though everything is negative on him he is old enough has risk factors I think to do with this gentleman would be to put him in the hospital. This is his third visit in less than a month for similar symptoms.        ____________________________________________   FINAL CLINICAL IMPRESSION(S) / ED DIAGNOSES  Final diagnoses:  Unstable angina [facility](HCC)     ED Discharge Orders    None       Note:  This document was prepared using Dragon voice recognition software and may include unintentional dictation  errors.    Arnaldo NatalMalinda, La Shehan F, MD 01/13/18 [phone]

## 2018-01-13 NOTE — ED Notes (Signed)
Assumed care of pt who is resting on stretcher with even, unlabored respirations. VSS. Pt denies any pain at this time. Call bell within reach, will continue to monitor.

## 2018-01-13 NOTE — ED Triage Notes (Addendum)
Pt comes via [facility] from home with c/o chest pain, dizziness and weakness for 2 days. Pt was recently seen here on Saturday for same complaint. Pt was put on Norvas at that time. Pt states he has been taking his medication as prescribed. Per EMS possible changes in last EKG. VS- BP-149/85. Pt was given 324 aspirin by EMS.

## 2018-01-13 NOTE — ED Notes (Signed)
Report called to [employer] RN and room is ready for pt. Pt updated with plan of care.

## 2018-01-14 ENCOUNTER — Observation Stay: Payer: Managed Care, Other (non HMO)

## 2018-01-14 ENCOUNTER — Observation Stay (HOSPITAL_BASED_OUTPATIENT_CLINIC_OR_DEPARTMENT_OTHER)
Admit: 2018-01-14 | Discharge: 2018-01-14 | Disposition: A | Discharge status: Home / Self Care | Payer: Managed Care, Other (non HMO) | Attending: Family Medicine | Admitting: Family Medicine

## 2018-01-14 DIAGNOSIS — I5030 Unspecified diastolic (congestive) heart failure: Secondary | ICD-10-CM

## 2018-01-14 LAB — TROPONIN I

## 2018-01-14 LAB — HIV ANTIBODY (ROUTINE TESTING W REFLEX): HIV SCREEN 4TH GENERATION: NONREACTIVE

## 2018-01-14 LAB — ECHOCARDIOGRAM COMPLETE
HEIGHTINCHES: 76 in
WEIGHTICAEL: 4402.15 [oz_av]

## 2018-01-14 NOTE — Progress Notes (Signed)
[facility] - [place] at [facility]   PATIENT NAME: Rodney Conceptionubrey Martensen    MR#:  [id number]  DATE OF BIRTH:  Apr 27, 1966  SUBJECTIVE:  CHIEF COMPLAINT:  Pt gets intemittent chest discomfort, prefers IP stress test  REVIEW OF SYSTEMS:  CONSTITUTIONAL: No fever, fatigue or weakness.  EYES: No blurred or double vision.  EARS, NOSE, AND THROAT: No tinnitus or ear pain.  RESPIRATORY: No cough, shortness of breath, wheezing or hemoptysis.  CARDIOVASCULAR: No chest pain, orthopnea, edema.  GASTROINTESTINAL: No nausea, vomiting, diarrhea or abdominal pain.  GENITOURINARY: No dysuria, hematuria.  ENDOCRINE: No polyuria, nocturia,  HEMATOLOGY: No anemia, easy bruising or bleeding SKIN: No rash or lesion. MUSCULOSKELETAL: No joint pain or arthritis.   NEUROLOGIC: No tingling, numbness, weakness.  PSYCHIATRY: No anxiety or depression.   DRUG ALLERGIES:  No Known Allergies  VITALS:  Blood pressure 125/82, pulse 60, temperature 98 F (36.7 C), temperature source Oral, resp. rate 18, height 6\' 4"  (1.93 m), weight 124.8 kg (275 lb 2.2 oz), SpO2 99 %.  PHYSICAL EXAMINATION:  GENERAL:  52 y.o.-year-old patient lying in the bed with no acute distress.  EYES: Pupils equal, round, reactive to light and accommodation. No scleral icterus. Extraocular muscles intact.  HEENT: Head atraumatic, normocephalic. Oropharynx and nasopharynx clear.  NECK:  Supple, no jugular venous distention. No thyroid enlargement, no tenderness.  LUNGS: Normal breath sounds bilaterally, no wheezing, rales,rhonchi or crepitation. No use of accessory muscles of respiration.  CARDIOVASCULAR: S1, S2 normal. No murmurs, rubs, or gallops.  ABDOMEN: Soft, nontender, nondistended. Bowel sounds present. No organomegaly or mass.  EXTREMITIES: No pedal edema, cyanosis, or clubbing.  NEUROLOGIC: Cranial nerves II through XII are intact. Muscle strength 5/5 in all extremities. Sensation intact. Gait not checked.   PSYCHIATRIC: The patient is alert and oriented x 3.  SKIN: No obvious rash, lesion, or ulcer.    LABORATORY PANEL:   CBC Recent Labs  Lab 01/13/18 1811  WBC 7.2  HGB 14.4  HCT 43.0  PLT 271   ------------------------------------------------------------------------------------------------------------------  Chemistries  Recent Labs  Lab 01/13/18 1239 01/13/18 1811  NA 135  --   K 3.9  --   CL 100*  --   CO2 25  --   GLUCOSE 105*  --   BUN 14  --   CREATININE 1.26* 1.20  CALCIUM 9.4  --   AST 20  --   ALT 25  --   ALKPHOS 83  --   BILITOT 0.6  --    ------------------------------------------------------------------------------------------------------------------  Cardiac Enzymes Recent Labs  Lab 01/13/18 2326  TROPONINI <0.03   ------------------------------------------------------------------------------------------------------------------  RADIOLOGY:  Dg Chest Portable 1 View  Result Date: 01/13/2018 CLINICAL DATA:  Chest pain for several days EXAM: PORTABLE CHEST 1 VIEW COMPARISON:  12/29/2017 FINDINGS: Cardiac shadow is stable. The lungs are well aerated bilaterally. No focal infiltrate or sizable effusion is seen. No bony abnormality is noted. IMPRESSION: No active disease. Electronically Signed   By: Alcide CleverMark  Lukens M.D.   On: 01/13/2018 13:11    EKG:   Orders placed or performed during the hospital encounter of 01/13/18  . ED EKG  . ED EKG  . EKG 12-Lead  . EKG 12-Lead  . EKG    ASSESSMENT AND PLAN:   Acute on chronic intermittent chest pain 1-2 years in duration, [facility]-results unknown via care everywhere Neg cardiac enzymes, ruled out ami  NPO after MN  consulted  cardiology  for expert opinion, proceed with nuclear medicine stress testing in the morning  records from [facility] to chart  aspirin daily, beta-blocker therapy, check lipids in the morning  nitrates as needed, IV morphine PRN  breakthrough pain  supplemental oxygen to keep pox > 94  continue close medical monitoring  *Chronic benign essential hypertension Stable Lopressor, vitals per routine, make changes as per necessary  *Chronic hyperlipidemia, unspecified Check lipids in the morning  *Chronic obesity Lifestyle modification recommended       All the records are reviewed and case discussed with Care Management/Social Workerr. Management plans discussed with the patient, family and they are in agreement.  CODE STATUS: fc  TOTAL TIME TAKING CARE OF THIS PATIENT: 34 minutes.   POSSIBLE D/C IN 1 DAYS, DEPENDING ON CLINICAL CONDITION.  Note: This dictation was prepared with Dragon dictation along with smaller phrase technology. Any transcriptional errors that result from this process are unintentional.   Ramonita Lab M.D on 01/14/2018 at 4:17 PM  Between 7am to 6pm - Pager - [phone] After 6pm go to [email or web address] - password [email or web address]  [facility]  Office  [phone]  CC: Primary care physician; System, Pcp Not In

## 2018-01-14 NOTE — Progress Notes (Signed)
*  PRELIMINARY RESULTS* Echocardiogram 2D Echocardiogram has been performed.  Cristela BlueHege, Delane Wessinger 01/14/2018, 9:59 AM

## 2018-01-14 NOTE — Consult Note (Signed)
[facility] Cardiology  CARDIOLOGY CONSULT NOTE  Patient ID: Rodney Garrison MRN: [id number] DOB/AGE: [birth year] 52 y.o.  Admit date: 01/13/2018 Referring Physician Salary Primary Physician Crowder Primary Cardiologist none per patient Reason for Consultation Chest pain  HPI: 52 year old male referred for evaluation of chest pain.  Patient has a history of hyperlipidemia, hypertension, sleep apnea, and obesity. The patient reports a 38-month history of generalized weakness, decreased energy level, exertional shortness of breath, and overall feeling unwell. The patient states that he had had intermittent episodes of chest discomfort for the last 1-2 years, described as a tightness and fluttering sensation, with and without exertion, that would last a few seconds. Yesterday, the patient felt unwell with some chest tightness, and thought his blood pressure was elevated, so drove to a local EMS location for evaluation. His blood pressure was reportedly within normal limits, but he was told that the 2 ECGs that were performed were abnormal. He was transported to [facility] ER for further evaluation. He states that at the time he did have some vague chest discomfort. In the ER, ECG revealed sinus bradycardia at a rate of 56 bpm with normal early repolarization pattern without evidence of ischemia. Admission labs notable for troponin less than 0.03 x 4.   Of note, the patient was recently started on amlodipine per his PCP, and reported feeling sluggish, short of breath, with palpitations within an hour of taking it. The patient was also evaluated at [facility] ER on 12/29/17 for shortness of breath and fatigue, though his work-up was largely negative, and he was discharged. The patient also had a treadmill stress test at [facility] per his PCP in Dec. 2018. The results, which the patient shared with me through [facility] were "equivocal," but the specific results are not available to view.  Review of systems  complete and found to be negative unless listed above     Past Medical History:  Diagnosis Date  . Hemorrhoid   . Hypertension     Past Surgical History:  Procedure Laterality Date  . BACK SURGERY    . KNEE SURGERY     right    No medications prior to admission.   Social History   Socioeconomic History  . Marital status: Single    Spouse name: Not on file  . Number of children: Not on file  . Years of education: Not on file  . Highest education level: Not on file  Occupational History  . Not on file  Social Needs  . Financial resource strain: Not on file  . Food insecurity:    Worry: Not on file    Inability: Not on file  . Transportation needs:    Medical: Not on file    Non-medical: Not on file  Tobacco Use  . Smoking status: Never Smoker  . Smokeless tobacco: Never Used  Substance and Sexual Activity  . Alcohol use: No  . Drug use: Not on file  . Sexual activity: Not on file  Lifestyle  . Physical activity:    Days per week: Not on file    Minutes per session: Not on file  . Stress: Not on file  Relationships  . Social connections:    Talks on phone: Not on file    Gets together: Not on file    Attends religious service: Not on file    Active member of club or organization: Not on file    Attends meetings of clubs or organizations: Not on file  Relationship status: Not on file  . Intimate partner violence:    Fear of current or ex partner: Not on file    Emotionally abused: Not on file    Physically abused: Not on file    Forced sexual activity: Not on file  Other Topics Concern  . Not on file  Social History Narrative  . Not on file    History reviewed. No pertinent family history.    Review of systems complete and found to be negative unless listed above      PHYSICAL EXAM  General: Well developed, well nourished, in no acute distress, reclining in bed HEENT:  Normocephalic and atramatic Neck:  No JVD.  Lungs: Clear bilaterally to  auscultation, normal effort of breathing Heart: HRRR . Normal S1 and S2 without gallops or murmurs.  Abdomen: Bowel sounds are positive, abdomen nondistended  Msk:  Back normal, normal gait. Normal strength and tone for age. Extremities: No clubbing, cyanosis or edema.   Neuro: Alert and oriented X 3. Psych:  Good affect, responds appropriately  Labs:   Lab Results  Component Value Date   WBC 7.2 01/13/2018   HGB 14.4 01/13/2018   HCT 43.0 01/13/2018   MCV 81.2 01/13/2018   PLT 271 01/13/2018    Recent Labs  Lab 01/13/18 1239 01/13/18 1811  NA 135  --   K 3.9  --   CL 100*  --   CO2 25  --   BUN 14  --   CREATININE 1.26* 1.20  CALCIUM 9.4  --   PROT 8.6*  --   BILITOT 0.6  --   ALKPHOS 83  --   ALT 25  --   AST 20  --   GLUCOSE 105*  --    Lab Results  Component Value Date   CKTOTAL 271 01/11/2018   TROPONINI <0.03 01/13/2018   No results found for: CHOL No results found for: HDL No results found for: LDLCALC No results found for: TRIG No results found for: CHOLHDL No results found for: LDLDIRECT    Radiology: Dg Chest 2 View  Result Date: 12/29/2017 CLINICAL DATA:  Chest pain, shortness of breath, and nausea for 3 days. EXAM: CHEST - 2 VIEW COMPARISON:  07/16/2017 FINDINGS: The cardiac silhouette is borderline to mildly enlarged. No airspace consolidation, edema, pleural effusion, or pneumothorax is identified. No acute osseous abnormality is seen. IMPRESSION: No active cardiopulmonary disease. Electronically Signed   By: Sebastian AcheAllen  Grady M.D.   On: 12/29/2017 11:39   Ct Head Wo Contrast  Result Date: 01/11/2018 CLINICAL DATA:  Acute onset of headache and dizziness. EXAM: CT HEAD WITHOUT CONTRAST TECHNIQUE: Contiguous axial images were obtained from the base of the skull through the vertex without intravenous contrast. COMPARISON:  None. FINDINGS: Brain: No evidence of acute infarction, hemorrhage, hydrocephalus, extra-axial collection or mass lesion/mass effect. The  posterior fossa, including the cerebellum, brainstem and fourth ventricle, is within normal limits. The third and lateral ventricles, and basal ganglia are unremarkable in appearance. The cerebral hemispheres are symmetric in appearance, with normal gray-white differentiation. No mass effect or midline shift is seen. Vascular: No hyperdense vessel or unexpected calcification. Skull: There is no evidence of fracture; visualized osseous structures are unremarkable in appearance. Sinuses/Orbits: The orbits are within normal limits. The paranasal sinuses and mastoid air cells are well-aerated. Other: No significant soft tissue abnormalities are seen. IMPRESSION: Unremarkable noncontrast CT of the head. Electronically Signed   By: Roanna RaiderJeffery  Chang M.D.   On:  01/11/2018 00:50   Dg Chest Portable 1 View  Result Date: 01/13/2018 CLINICAL DATA:  Chest pain for several days EXAM: PORTABLE CHEST 1 VIEW COMPARISON:  12/29/2017 FINDINGS: Cardiac shadow is stable. The lungs are well aerated bilaterally. No focal infiltrate or sizable effusion is seen. No bony abnormality is noted. IMPRESSION: No active disease. Electronically Signed   By: Alcide Clever M.D.   On: 01/13/2018 13:11    EKG: Sinus rhythm, 83 bpm  ASSESSMENT AND PLAN:  1.  Chest pain, atypical, appears to be chronic in nature for the last 1 to 2 years.  Troponin less than 0.034. ECG without evidence of acute ischemia. 2.  Hyperlipidemia 3.  Hypertension, currently well controlled on metoprolol tartrate 4.  Generalized weakness x 1 month of unknown etiology.  Plan: 1.  Agree with overall therapy 2.  Lexiscan Myoview rescheduled for tomorrow morning 3. Review 2D echocardiogram 4. Recommend statin therapy pending results of lipid panel  Signed: Leanora Ivanoff PA-C 01/14/2018, 8:27 AM

## 2018-01-15 ENCOUNTER — Observation Stay: Payer: Managed Care, Other (non HMO)

## 2018-01-15 ENCOUNTER — Other Ambulatory Visit: Payer: Managed Care, Other (non HMO)

## 2018-01-15 ENCOUNTER — Encounter: Payer: Self-pay | Admitting: Radiology

## 2018-01-15 LAB — LIPID PANEL
Cholesterol: 234 mg/dL — ABNORMAL HIGH (ref 0–200)
HDL: 34 mg/dL — AB (ref >40)
LDL CALC: 182 mg/dL — AB (ref 0–99)
TRIGLYCERIDES: 89 mg/dL (ref <150)
Total CHOL/HDL Ratio: 6.9 RATIO
VLDL: 18 mg/dL (ref 0–40)

## 2018-01-15 LAB — NM MYOCAR MULTI W/SPECT W/WALL MOTION / EF
CSEPEDS: 1 s
CSEPHR: 68 %
CSEPPHR: 117 {beats}/min
Estimated workload: 1 METS
Exercise duration (min): 1 min
LVDIAVOL: 94 mL (ref 62–150)
LVSYSVOL: 36 mL
MPHR: 170 {beats}/min
Rest HR: 71 {beats}/min
SDS: 0
SRS: 11
SSS: 0
TID: 0.77

## 2018-01-15 MED ORDER — IOHEXOL 350 MG/ML SOLN
75.0000 mL | Freq: Once | INTRAVENOUS | Status: AC | PRN
  Administered 2018-01-15: 75 mL via INTRAVENOUS

## 2018-01-15 MED ORDER — REGADENOSON 0.4 MG/5ML IV SOLN
0.4000 mg | Freq: Once | INTRAVENOUS | Status: AC
  Administered 2018-01-15: 0.4 mg via INTRAVENOUS

## 2018-01-15 MED ORDER — TECHNETIUM TC 99M TETROFOSMIN IV KIT
30.0000 | PACK | Freq: Once | INTRAVENOUS | Status: AC | PRN
  Administered 2018-01-15: 29.813 via INTRAVENOUS

## 2018-01-15 MED ORDER — ATORVASTATIN CALCIUM 20 MG PO TABS: 20.0000 mg | ORAL_TABLET | Freq: Every day | ORAL | 0 refills | Status: AC

## 2018-01-15 MED ORDER — FAMOTIDINE 20 MG PO TABS: 20.0000 mg | ORAL_TABLET | Freq: Two times a day (BID) | ORAL | 1 refills | Status: AC

## 2018-01-15 MED ORDER — ASPIRIN EC 81 MG PO TBEC: 81.0000 mg | DELAYED_RELEASE_TABLET | Freq: Every day | ORAL | 2 refills | Status: AC

## 2018-01-15 MED ORDER — TECHNETIUM TC 99M TETROFOSMIN IV KIT
12.9200 | PACK | Freq: Once | INTRAVENOUS | Status: AC | PRN
  Administered 2018-01-15: 12.92 via INTRAVENOUS

## 2018-01-15 MED ORDER — METOPROLOL TARTRATE 50 MG PO TABS: 50.0000 mg | ORAL_TABLET | Freq: Two times a day (BID) | ORAL | 0 refills | Status: AC

## 2018-01-15 NOTE — Progress Notes (Signed)
Lexiscan stress test completed. No evidence of ischemia. Low risk study.

## 2018-01-15 NOTE — Discharge Summary (Signed)
[facility] - [place] at [facility]   PATIENT NAME: Rodney Garrison    MR#:  [id number]  DATE OF BIRTH:  1965/08/25  DATE OF ADMISSION:  01/13/2018 ADMITTING PHYSICIAN: Bertrum Sol, MD  DATE OF DISCHARGE: 01/15/18   PRIMARY CARE PHYSICIAN: System, Pcp Not In    ADMISSION DIAGNOSIS:  Unstable angina (HCC) [I20.0]  DISCHARGE DIAGNOSIS:  Active Problems:   Chest pain ch SOB  SECONDARY DIAGNOSIS:   Past Medical History:  Diagnosis Date  . Hemorrhoid   . Hypertension     HOSPITAL COURSE:   HISTORY OF PRESENT ILLNESS: Rodney Garrison  is a 52 y.o. male with a known history per below which also includes obesity, chronic low back pain, hyperlipidemia, presenting with acute on chronic chest pain which he has had over 1- 2 years intermittently, had exercise stress test done at [facility] in December 2018-results unknown, presents today with 1 to 2-week history of intermittent continued chest pain described as a sharp pain located in his mid chest associated with shortness of breath, diaphoresis, lightheadedness, dizziness, worse with deep breathing, patient also describes it as a chest tightness whereby he cannot catch his breath, in the emergency room work-up was unimpressive, patient evaluated in the emergency room, no apparent distress, resting comfortably in bed, patient is now been admitted for acute on chronic intermittent atypical chest pain secondary to unknown etiology.  *Acute on chronic intermittent chest pain 1-2 years in duration, exercise stress test done at [facility] in December 2018-results unknown via care everywhere Stress test is normal okay to discharge from cardiology standpoint CT angiogram of the chest is normal,  outpatient follow-up with pulmonology Dr. Nicholos Johns, appointment scheduled  aspirin daily, beta-blocker therapy , LDL -182, lipitor -20 mg  Outpatient follow-up with family care physician  *Hyperlipidemia patient is started on  statin  *Chronic benign essential hypertension  Stable Lopressor  *Chronic hyperlipidemia, unspecified Check lipids in the morning  *Chronic obesity Lifestyle modification recommended  *GERD Pepcid   DISCHARGE CONDITIONS:   stable  CONSULTS OBTAINED:  Treatment Team:  Marcina Millard, MD   PROCEDURES  Stress test   DRUG ALLERGIES:  No Known Allergies  DISCHARGE MEDICATIONS:   Allergies as of 01/15/2018   No Known Allergies     Medication List    TAKE these medications   aspirin EC 81 MG tablet Take 1 tablet (81 mg total) by mouth daily.   atorvastatin 20 MG tablet Commonly known as:  LIPITOR Take 1 tablet (20 mg total) by mouth daily.   famotidine 20 MG tablet Commonly known as:  PEPCID Take 1 tablet (20 mg total) by mouth 2 (two) times daily.   metoprolol tartrate 50 MG tablet Commonly known as:  LOPRESSOR Take 1 tablet (50 mg total) by mouth 2 (two) times daily.        DISCHARGE INSTRUCTIONS:  With primary care physician in a week Follow-up with cardiology in a week Follow-up with pulmonology Dr. Nicholos Johns in 3 to 5 days   DIET:  Cardiac diet  DISCHARGE CONDITION:  Stable  ACTIVITY:  Activity as tolerated  OXYGEN:  Home Oxygen: No.   Oxygen Delivery: room air  DISCHARGE LOCATION:  home   If you experience worsening of your admission symptoms, develop shortness of breath, life threatening emergency, suicidal or homicidal thoughts you must seek medical attention immediately by calling 911 or calling your MD immediately  if symptoms less severe.  You Must read complete instructions/literature along with all the  possible adverse reactions/side effects for all the Medicines you take and that have been prescribed to you. Take any new Medicines after you have completely understood and accpet all the possible adverse reactions/side effects.   Please note  You were cared for by a hospitalist during your hospital stay. If you  have any questions about your discharge medications or the care you received while you were in the hospital after you are discharged, you can call the unit and asked to speak with the hospitalist on call if the hospitalist that took care of you is not available. Once you are discharged, your primary care physician will handle any further medical issues. Please note that NO REFILLS for any discharge medications will be authorized once you are discharged, as it is imperative that you return to your primary care physician (or establish a relationship with a primary care physician if you do not have one) for your aftercare needs so that they can reassess your need for medications and monitor your lab values.     Today  Chief Complaint  Patient presents with  . Chest Pain   Patient stress test is normal.  Denies any chest pain.  Worried about chronic shortness of breath and wants to see pulmonology as an outpatient  ROS:  CONSTITUTIONAL: Denies fevers, chills. Denies any fatigue, weakness.  EYES: Denies blurry vision, double vision, eye pain. EARS, NOSE, THROAT: Denies tinnitus, ear pain, hearing loss. RESPIRATORY: Denies cough, wheeze, shortness of breath.  CARDIOVASCULAR: Denies chest pain, palpitations, edema.  GASTROINTESTINAL: Denies nausea, vomiting, diarrhea, abdominal pain. Denies bright red blood per rectum. GENITOURINARY: Denies dysuria, hematuria. ENDOCRINE: Denies nocturia or thyroid problems. HEMATOLOGIC AND LYMPHATIC: Denies easy bruising or bleeding. SKIN: Denies rash or lesion. MUSCULOSKELETAL: Denies pain in neck, back, shoulder, knees, hips or arthritic symptoms.  NEUROLOGIC: Denies paralysis, paresthesias.  PSYCHIATRIC: Denies anxiety or depressive symptoms.   VITAL SIGNS:  Blood pressure (!) 153/94, pulse 71, temperature 97.9 F (36.6 C), resp. rate 20, height 6\' 4"  (1.93 m), weight 124.8 kg (275 lb 2.2 oz), SpO2 100 %.  I/O:    Intake/Output Summary (Last 24 hours)  at 01/15/2018 1652 Last data filed at 01/15/2018 0400 Gross per 24 hour  Intake 290 ml  Output 0 ml  Net 290 ml    PHYSICAL EXAMINATION:  GENERAL:  52 y.o.-year-old patient lying in the bed with no acute distress.  EYES: Pupils equal, round, reactive to light and accommodation. No scleral icterus. Extraocular muscles intact.  HEENT: Head atraumatic, normocephalic. Oropharynx and nasopharynx clear.  NECK:  Supple, no jugular venous distention. No thyroid enlargement, no tenderness.  LUNGS: Normal breath sounds bilaterally, no wheezing, rales,rhonchi or crepitation. No use of accessory muscles of respiration.  CARDIOVASCULAR: S1, S2 normal. No murmurs, rubs, or gallops.  ABDOMEN: Soft, non-tender, non-distended. Bowel sounds present. No organomegaly or mass.  EXTREMITIES: No pedal edema, cyanosis, or clubbing.  NEUROLOGIC: Cranial nerves II through XII are intact. Muscle strength 5/5 in all extremities. Sensation intact. Gait not checked.  PSYCHIATRIC: The patient is alert and oriented x 3.  SKIN: No obvious rash, lesion, or ulcer.   DATA REVIEW:   CBC Recent Labs  Lab 01/13/18 1811  WBC 7.2  HGB 14.4  HCT 43.0  PLT 271    Chemistries  Recent Labs  Lab 01/13/18 1239 01/13/18 1811  NA 135  --   K 3.9  --   CL 100*  --   CO2 25  --   GLUCOSE 105*  --  BUN 14  --   CREATININE 1.26* 1.20  CALCIUM 9.4  --   AST 20  --   ALT 25  --   ALKPHOS 83  --   BILITOT 0.6  --     Cardiac Enzymes Recent Labs  Lab 01/13/18 2326  TROPONINI <0.03    Microbiology Results  No results found for this or any previous visit.  RADIOLOGY:  Ct Chest W Contrast  Result Date: 01/15/2018 CLINICAL DATA:  52 year old male with chronic shortness of breath and intermittent chest pain for 3 weeks. EXAM: CT CHEST WITH CONTRAST TECHNIQUE: Multidetector CT imaging of the chest was performed during intravenous contrast administration. CONTRAST:  75 cc intravenous Omnipaque 350 COMPARISON:   01/13/2018 and prior chest radiographs FINDINGS: Cardiovascular: Moderate to heavy coronary artery atherosclerotic calcifications noted. UPPER limits normal heart size. No thoracic aortic abnormality identified. No pericardial effusion. Mediastinum/Nodes: No enlarged mediastinal, hilar, or axillary lymph nodes. Thyroid gland, trachea, and esophagus demonstrate no significant findings. Lungs/Pleura: Lungs are clear. No pleural effusion or pneumothorax. Upper Abdomen: A 2.5 x 3 cm LEFT adrenal adenoma is noted. No acute abnormalities. Musculoskeletal: No chest wall abnormality. No acute or significant osseous findings. IMPRESSION: 1. No acute abnormality. 2. Coronary artery disease and UPPER limits normal heart size. 3. LEFT adrenal adenoma. Electronically Signed   By: Harmon PierJeffrey  Hu M.D.   On: 01/15/2018 16:04   Nm Myocar Multi W/spect W/wall Motion / Ef  Result Date: 01/15/2018  The study is normal.  This is a low risk study.  The left ventricular ejection fraction is normal (55-65%).  There was no ST segment deviation noted during stress.  Negative lexiscan stress Now risk study. No evidence of ischemia   Dg Chest Portable 1 View  Result Date: 01/13/2018 CLINICAL DATA:  Chest pain for several days EXAM: PORTABLE CHEST 1 VIEW COMPARISON:  12/29/2017 FINDINGS: Cardiac shadow is stable. The lungs are well aerated bilaterally. No focal infiltrate or sizable effusion is seen. No bony abnormality is noted. IMPRESSION: No active disease. Electronically Signed   By: Alcide CleverMark  Lukens M.D.   On: 01/13/2018 13:11    EKG:   Orders placed or performed during the hospital encounter of 01/13/18  . ED EKG  . ED EKG  . EKG 12-Lead  . EKG 12-Lead  . EKG      Management plans discussed with the patient, family and they are in agreement.  CODE STATUS:     Code Status Orders  (From admission, onward)        Start     Ordered   01/13/18 1735  Full code  Continuous     01/13/18 1734    Code Status History     This patient has a current code status but no historical code status.      TOTAL TIME TAKING CARE OF THIS PATIENT: 42 minutes.   Note: This dictation was prepared with Dragon dictation along with smaller phrase technology. Any transcriptional errors that result from this process are unintentional.   @[facility] @  on 01/15/2018 at 4:52 PM  Between 7am to 6pm - Pager - [phone]  After 6pm go to [email or web address] - password [email or web address]  [facility]  Office  [phone]  CC: Primary care physician; System, Pcp Not In

## 2018-01-15 NOTE — Progress Notes (Signed)
Discharge order received. Patient is alert and oriented. Vital signs stable . No signs of acute distress. Discharge instructions given. Patient verbalized understanding. No other issues noted at this time.   

## 2018-01-16 ENCOUNTER — Encounter: Payer: Self-pay | Admitting: *Deleted

## 2018-01-19 ENCOUNTER — Ambulatory Visit (INDEPENDENT_AMBULATORY_CARE_PROVIDER_SITE_OTHER): Payer: Managed Care, Other (non HMO) | Admitting: Internal Medicine

## 2018-01-19 ENCOUNTER — Encounter: Payer: Self-pay | Admitting: Internal Medicine

## 2018-01-19 VITALS — BP 134/84 | HR 65 | Ht 76.0 in | Wt 275.0 lb

## 2018-01-19 DIAGNOSIS — R0609 Other forms of dyspnea: Secondary | ICD-10-CM | POA: Diagnosis not present

## 2018-01-19 DIAGNOSIS — J4550 Severe persistent asthma, uncomplicated: Secondary | ICD-10-CM

## 2018-01-19 NOTE — Progress Notes (Signed)
[facility]  Pulmonary Medicine Consultation      Assessment and Plan:  Dyspnea of uncertain etiology. - Possibilities include asthma, chronic bronchitis, deconditioning. - Discussed CT results which show normal lungs, no evidence of cancer. - Keep appointment with cardiology to rule out cardiac cause of symptoms.  Obstructive sleep apnea. - Diagnosed with sleep apnea proximally 1 year ago, recently started on CPAP in the past week. - Thus far he is acclimating still, however the patient's phone app shows good compliance with residual AHI of less than 1.   Date: 01/19/2018  MRN# [id number] Rodney Garrison 1965/11/15  Referring Physician:   Marijo Conceptionubrey Garrison is a 52 y.o. old male seen in consultation for chief complaint of:    Chief Complaint  Patient presents with  . Consult    ED referral:sob with activity since last Nov 2018  . Hoarse  . Fatigue  . Sleep Apnea    started on 01/16/18    HPI:   The patient is a 52 year old male with multiple ED visits over the last month with chest pain, dyspnea.  The patient was admitted to the hospital on 01/13/2018, for 2 days.  He was having chest pain with palpitations of uncertain etiology.  He was seen by cardiology underwent a stress test which was negative.  He also underwent a CT angio which was unrevealing. He was then discharged,  it was thought that anxiety may be playing a role in his dyspnea.   He tells me that for the past 6-7 months he has been having dizziness and weakness. He would get dyspnea and fatigue with exertion. This has progressed over the past few months. He was on losartan but stopped due a recall, so stopped it and initially doing well but then went up and went to the ED. He was then started on norvasc but made him very tired with headaches when starting on it, so stopped it after 3 days. His BP was normal, but he was having chest tightness, went to the hospital and was admitted.  He has been very worried having cancer or some  other condition.  He used to be very active with sports and exercise, but he has not done any thing over the last few years. He works for [employer] doing [employer], and is going door to door.   He was diagnosed with OSA about a year ago, he was started on CPAP last week. He has been tolerating it ok, but is still getting used to it.    **Imaging personally reviewed, CT chest/angio on 01/15/2018; lungs are normal.   PMHX:   Past Medical History:  Diagnosis Date  . Hemorrhoid   . Hypertension    Surgical Hx:  Past Surgical History:  Procedure Laterality Date  . BACK SURGERY    . KNEE SURGERY     right   Family Hx:  History reviewed. No pertinent family history. Social Hx:   Social History   Tobacco Use  . Smoking status: Never Smoker  . Smokeless tobacco: Never Used  Substance Use Topics  . Alcohol use: No  . Drug use: Never   Medication:    Current Outpatient Medications:  .  aspirin EC 81 MG tablet, Take 1 tablet (81 mg total) by mouth daily., Disp: 150 tablet, Rfl: 2 .  famotidine (PEPCID) 20 MG tablet, Take 1 tablet (20 mg total) by mouth 2 (two) times daily., Disp: 60 tablet, Rfl: 1 .  metoprolol tartrate (LOPRESSOR) 50 MG tablet, Take 1 tablet (  50 mg total) by mouth 2 (two) times daily., Disp: 60 tablet, Rfl: 0 .  atorvastatin (LIPITOR) 20 MG tablet, Take 1 tablet (20 mg total) by mouth daily. (Patient not taking: Reported on 01/19/2018), Disp: 30 tablet, Rfl: 0   Allergies:  Patient has no known allergies.  Review of Systems: Gen:  Denies  fever, sweats, chills HEENT: Denies blurred vision, double vision. bleeds, sore throat Cvc:  No dizziness, chest pain. Resp:   Denies cough or sputum production, shortness of breath Gi: Denies swallowing difficulty, stomach pain. Gu:  Denies bladder incontinence, burning urine Ext:   No Joint pain, stiffness. Skin: No skin rash,  hives  Endoc:  No polyuria, polydipsia. Psych: No depression, insomnia. Other:  All other systems  were reviewed with the patient and were negative other that what is mentioned in the HPI.   Physical Examination:   VS: BP 134/84 (BP Location: Left Arm, Cuff Size: Large)   Pulse 65   Ht 6\' 4"  (1.93 m)   Wt 275 lb (124.7 kg)   SpO2 98%   BMI 33.47 kg/m   General Appearance: No distress  Neuro:without focal findings,  speech normal,  HEENT: PERRLA, EOM intact.   Pulmonary: normal breath sounds, No wheezing.  CardiovascularNormal S1,S2.  No m/r/g.   Abdomen: Benign, Soft, non-tender. Renal:  No costovertebral tenderness  GU:  No performed at this time. Endoc: No evident thyromegaly, no signs of acromegaly. Skin:   warm, no rashes, no ecchymosis  Extremities: normal, no cyanosis, clubbing.  Other findings:    LABORATORY PANEL:   CBC Recent Labs  Lab 01/13/18 1811  WBC 7.2  HGB 14.4  HCT 43.0  PLT 271   ------------------------------------------------------------------------------------------------------------------  Chemistries  Recent Labs  Lab 01/13/18 1239 01/13/18 1811  NA 135  --   K 3.9  --   CL 100*  --   CO2 25  --   GLUCOSE 105*  --   BUN 14  --   CREATININE 1.26* 1.20  CALCIUM 9.4  --   AST 20  --   ALT 25  --   ALKPHOS 83  --   BILITOT 0.6  --    ------------------------------------------------------------------------------------------------------------------  Cardiac Enzymes Recent Labs  Lab 01/13/18 2326  TROPONINI <0.03   ------------------------------------------------------------  RADIOLOGY:  No results found.     Thank  you for the consultation and for allowing [facility] to assist in the care of your patient. Our recommendations are noted above.  Please contact us if we can be of further service.   Wells Guiles, MD.  Board Certified in Internal Medicine, Pulmonary Medicine, Critical Care Medicine, and Sleep Medicine.  [place] Office Number: [phone]  Santiago Glad, M.D.  Billy Fischer, M.D  01/19/2018

## 2018-01-19 NOTE — Patient Instructions (Addendum)
Will send you for a lung function test.  We will discuss results with you at next follow-up visit.

## 2018-01-27 ENCOUNTER — Ambulatory Visit: Payer: Managed Care, Other (non HMO) | Attending: Internal Medicine

## 2018-01-27 ENCOUNTER — Ambulatory Visit: Payer: Managed Care, Other (non HMO)

## 2018-01-27 DIAGNOSIS — R0609 Other forms of dyspnea: Secondary | ICD-10-CM | POA: Diagnosis not present

## 2018-01-27 DIAGNOSIS — J4550 Severe persistent asthma, uncomplicated: Secondary | ICD-10-CM | POA: Diagnosis not present

## 2018-01-27 MED ORDER — METHACHOLINE 16 MG/ML NEB SOLN
2.0000 mL | Freq: Once | RESPIRATORY_TRACT | Status: DC
  Filled 2018-01-27: qty 2

## 2018-01-27 MED ORDER — METHACHOLINE 0.0625 MG/ML NEB SOLN
2.0000 mL | Freq: Once | RESPIRATORY_TRACT | Status: DC
  Filled 2018-01-27: qty 2

## 2018-01-27 MED ORDER — METHACHOLINE 4 MG/ML NEB SOLN
2.0000 mL | Freq: Once | RESPIRATORY_TRACT | Status: DC
  Filled 2018-01-27: qty 2

## 2018-01-27 MED ORDER — METHACHOLINE 0.25 MG/ML NEB SOLN
2.0000 mL | Freq: Once | RESPIRATORY_TRACT | Status: DC
  Filled 2018-01-27: qty 2

## 2018-01-27 MED ORDER — ALBUTEROL SULFATE (2.5 MG/3ML) 0.083% IN NEBU
2.5000 mg | INHALATION_SOLUTION | Freq: Once | RESPIRATORY_TRACT | Status: DC
  Filled 2018-01-27: qty 3

## 2018-01-27 MED ORDER — METHACHOLINE 1 MG/ML NEB SOLN
2.0000 mL | Freq: Once | RESPIRATORY_TRACT | Status: DC
  Filled 2018-01-27: qty 2

## 2018-01-27 MED ORDER — SODIUM CHLORIDE 0.9 % IN NEBU: 3.0000 mL | INHALATION_SOLUTION | Freq: Once | RESPIRATORY_TRACT | Status: DC

## 2018-01-29 ENCOUNTER — Ambulatory Visit: Payer: Managed Care, Other (non HMO)

## 2018-01-29 ENCOUNTER — Ambulatory Visit: Payer: Managed Care, Other (non HMO) | Attending: Internal Medicine

## 2018-01-29 DIAGNOSIS — R0609 Other forms of dyspnea: Secondary | ICD-10-CM

## 2018-01-29 DIAGNOSIS — J4550 Severe persistent asthma, uncomplicated: Secondary | ICD-10-CM | POA: Diagnosis present

## 2018-01-29 MED ORDER — ALBUTEROL SULFATE (2.5 MG/3ML) 0.083% IN NEBU
2.5000 mg | INHALATION_SOLUTION | Freq: Once | RESPIRATORY_TRACT | Status: AC
  Administered 2018-01-29: 2.5 mg via RESPIRATORY_TRACT
  Filled 2018-01-29: qty 3

## 2018-01-29 MED ORDER — METHACHOLINE 0.25 MG/ML NEB SOLN
2.0000 mL | Freq: Once | RESPIRATORY_TRACT | Status: AC
  Administered 2018-01-29: 0.5 mg via RESPIRATORY_TRACT
  Filled 2018-01-29: qty 2

## 2018-01-29 MED ORDER — METHACHOLINE 1 MG/ML NEB SOLN
2.0000 mL | Freq: Once | RESPIRATORY_TRACT | Status: AC
  Administered 2018-01-29: 2 mg via RESPIRATORY_TRACT
  Filled 2018-01-29: qty 2

## 2018-01-29 MED ORDER — METHACHOLINE 16 MG/ML NEB SOLN
2.0000 mL | Freq: Once | RESPIRATORY_TRACT | Status: AC
  Administered 2018-01-29: 32 mg via RESPIRATORY_TRACT
  Filled 2018-01-29: qty 2

## 2018-01-29 MED ORDER — METHACHOLINE 4 MG/ML NEB SOLN
2.0000 mL | Freq: Once | RESPIRATORY_TRACT | Status: AC
  Administered 2018-01-29: 8 mg via RESPIRATORY_TRACT
  Filled 2018-01-29: qty 2

## 2018-01-29 MED ORDER — SODIUM CHLORIDE 0.9 % IN NEBU
3.0000 mL | INHALATION_SOLUTION | Freq: Once | RESPIRATORY_TRACT | Status: AC
  Administered 2018-01-29: 3 mL via RESPIRATORY_TRACT

## 2018-01-29 MED ORDER — METHACHOLINE 0.0625 MG/ML NEB SOLN
2.0000 mL | Freq: Once | RESPIRATORY_TRACT | Status: AC
  Administered 2018-01-29: 0.125 mg via RESPIRATORY_TRACT
  Filled 2018-01-29: qty 2

## 2018-02-02 NOTE — Progress Notes (Signed)
[facility] Pulmonary Medicine     Assessment and Plan:  Mild episodic dyspnea of uncertain etiology. - Negative PFT and methacholine challenge make COPD/emphysema/chronic bronchitis/asthma unlikely. - Patient has an adrenal adenoma, episodic dyspnea raise a possibility of carcinoid, though this seems unlikely.  If symptoms progress could consider a 24-hour urine 5-HIAA testing. - Discussed CT results which show normal lungs, no evidence of cancer. - Keep appointment with cardiology to rule out cardiac cause of symptoms.  Obstructive sleep apnea. - Diagnosed with sleep apnea proximally 1 year ago, recently started on CPAP in the past week. - Thus far he is acclimating still, however the patient's phone app shows good compliance with residual AHI of less than 1.   Date: 02/02/2018  MRN# [id number] Colyn Miron 03/20/1966  Referring Physician:   Mead Slane is a 52 y.o. old male seen in consultation for chief complaint of:    Chief Complaint  Patient presents with  . Shortness of Breath    pt here for f/u with PFT results. he reports breathing improving.    HPI:  Per previous OV 01/19/2018 The patient is a 52 year old male with multiple ED visits recently with chest pain, dyspnea.  The patient was admitted to the hospital on 01/13/2018, for 2 days.  He has been having dizziness with weakness and fatigue for the last several months, which has been progressive over the past few months.  He was recently started on CPAP for obstructive sleep apnea. He used to be very active with sports and exercise, but he has not done any thing over the last few years. He works for [employer] doing [employer], and is going door to door.   He feels that his breathing has been doing better, he had only 1 breathing episode since his last visit. The episode lasted for 5 minutes, then went back to normal. He notes that his voice gets raspy occasionally, he has continues on flonase for sinus congestion. He has seen a  cardiologist and was told that his heart appeared to be ok.  He has continued on cpap every night for OSA, and doing well with it.   **Methacholine challenge 01/29/2018>> no significant change in response to bronchoprovocation. **Pulmonary function test 01/27/2018>> spirometry shows FVC of 91%, FEV1 is 93%.  Ratio is 80%.  Flow volume loop is normal. Total lung capacity is 89% predicted, vital capacity is 91% predicted, ratio is normal.  Diffusion capacity is normal at 99%. - Overall this test shows normal pulmonary functions without evidence of obstructive lung disease or restrictive lung disease. tracings of the pulmonary function tests were personally reviewed. **CT chest/angio on 01/15/2018>> lungs are normal.   Social Hx:   Social History   Tobacco Use  . Smoking status: Never Smoker  . Smokeless tobacco: Never Used  Substance Use Topics  . Alcohol use: No  . Drug use: Never   Medication:    Current Outpatient Medications:  .  aspirin EC 81 MG tablet, Take 1 tablet (81 mg total) by mouth daily., Disp: 150 tablet, Rfl: 2 .  atorvastatin (LIPITOR) 20 MG tablet, Take 1 tablet (20 mg total) by mouth daily., Disp: 30 tablet, Rfl: 0 .  famotidine (PEPCID) 20 MG tablet, Take 1 tablet (20 mg total) by mouth 2 (two) times daily., Disp: 60 tablet, Rfl: 1 .  metoprolol tartrate (LOPRESSOR) 50 MG tablet, Take 1 tablet (50 mg total) by mouth 2 (two) times daily., Disp: 60 tablet, Rfl: 0   Allergies:  Patient has  no known allergies.  Review of Systems: Gen:  Denies  fever, sweats, chills HEENT: Denies blurred vision, double vision. bleeds, sore throat Cvc:  No dizziness, chest pain. Resp:   Denies cough or sputum production, shortness of breath Gi: Denies swallowing difficulty, stomach pain. Gu:  Denies bladder incontinence, burning urine Ext:   No Joint pain, stiffness. Skin: No skin rash,  hives  Endoc:  No polyuria, polydipsia. Psych: No depression, insomnia. Other:  All other  systems were reviewed with the patient and were negative other that what is mentioned in the HPI.   Physical Examination:   VS: BP 132/82 (BP Location: Left Arm, Cuff Size: Large)   Pulse 63   Resp 16   Ht 6\' 4"  (1.93 m)   Wt 277 lb (125.6 kg)   SpO2 99%   BMI 33.72 kg/m   General Appearance: No distress  Neuro:without focal findings,  speech normal,  HEENT: PERRLA, EOM intact.   Pulmonary: normal breath sounds, No wheezing.  CardiovascularNormal S1,S2.  No m/r/g.   Abdomen: Benign, Soft, non-tender. Renal:  No costovertebral tenderness  GU:  No performed at this time. Endoc: No evident thyromegaly, no signs of acromegaly. Skin:   warm, no rashes, no ecchymosis  Extremities: normal, no cyanosis, clubbing.  Other findings:    LABORATORY PANEL:   CBC No results for input(s): WBC, HGB, HCT, PLT in the last 168 hours. ------------------------------------------------------------------------------------------------------------------  Chemistries  No results for input(s): NA, K, CL, CO2, GLUCOSE, BUN, CREATININE, CALCIUM, MG, AST, ALT, ALKPHOS, BILITOT in the last 168 hours.  Invalid input(s): GFRCGP ------------------------------------------------------------------------------------------------------------------  Cardiac Enzymes No results for input(s): TROPONINI in the last 168 hours. ------------------------------------------------------------  RADIOLOGY:  No results found.     Thank  you for the consultation and for allowing [facility] to assist in the care of your patient. Our recommendations are noted above.  Please contact us if we can be of further service.   Wells Guileseep Tirso Laws, MD.  Board Certified in Internal Medicine, Pulmonary Medicine, Critical Care Medicine, and Sleep Medicine.  [facility] Office Number: [phone]  Santiago Gladavid Kasa, M.D.  Billy Fischeravid Simonds, M.D  02/02/2018

## 2018-02-03 ENCOUNTER — Ambulatory Visit (INDEPENDENT_AMBULATORY_CARE_PROVIDER_SITE_OTHER): Payer: Managed Care, Other (non HMO) | Admitting: Internal Medicine

## 2018-02-03 ENCOUNTER — Encounter: Payer: Self-pay | Admitting: Internal Medicine

## 2018-02-03 VITALS — BP 132/82 | HR 63 | Resp 16 | Ht 76.0 in | Wt 277.0 lb

## 2018-02-03 DIAGNOSIS — R0609 Other forms of dyspnea: Secondary | ICD-10-CM | POA: Diagnosis not present

## 2018-02-03 NOTE — Patient Instructions (Signed)
Continue your current medications, follow up as needed.

## 2019-06-05 ENCOUNTER — Other Ambulatory Visit: Payer: Self-pay

## 2019-06-05 DIAGNOSIS — Z20822 Contact with and (suspected) exposure to covid-19: Secondary | ICD-10-CM

## 2019-06-07 LAB — NOVEL CORONAVIRUS, NAA: SARS-CoV-2, NAA: DETECTED — AB

## 2019-06-08 ENCOUNTER — Telehealth: Payer: Self-pay | Admitting: Critical Care Medicine

## 2019-06-08 NOTE — Telephone Encounter (Signed)
I spoke to this patient regarding his positive Covid test that occurred on October 24.  Note he had previously been tested on October 21 and was positive at that time.  Note this patient had symptom onset October 19.  The patient is being followed and managed by his primary care provider who is with a [facility] primary care practice in [facility].  The patient states he will follow-up with his primary care doctor regarding his Covid diagnosis and symptoms.  He has been in isolation since last Monday.  He also knows the health department may be in touch with him.  He elected to get retested on October 24 and I told him after a positive test on 21 October that was likely too soon to be retested due to the length of time it takes for the virus particles to clear out of the system.  He took this under advisement.

## 2019-06-08 NOTE — Telephone Encounter (Signed)
-----   Message from Carlisle Beers, RN sent at 06/07/2019 10:29 AM EDT ----- Acquanetta Belling results to Dr Doreene Burke and Dr Joya Gaskins. Will notify Health department. Attempted to call PCP but was disconnected after long hold time. Attempted to call back and was on hold. Will try again later.

## 2019-06-16 ENCOUNTER — Other Ambulatory Visit: Payer: Self-pay | Admitting: *Deleted

## 2019-06-16 DIAGNOSIS — Z20822 Contact with and (suspected) exposure to covid-19: Secondary | ICD-10-CM

## 2020-01-17 IMAGING — CR DG CHEST 2V
2 series · 2 of 2 positions shown · non-contrast
Comparison: 07/16/2017

CLINICAL DATA: Chest pain, shortness of breath, and nausea for 3
days.

EXAM:
CHEST - 2 VIEW

[w chest pa]
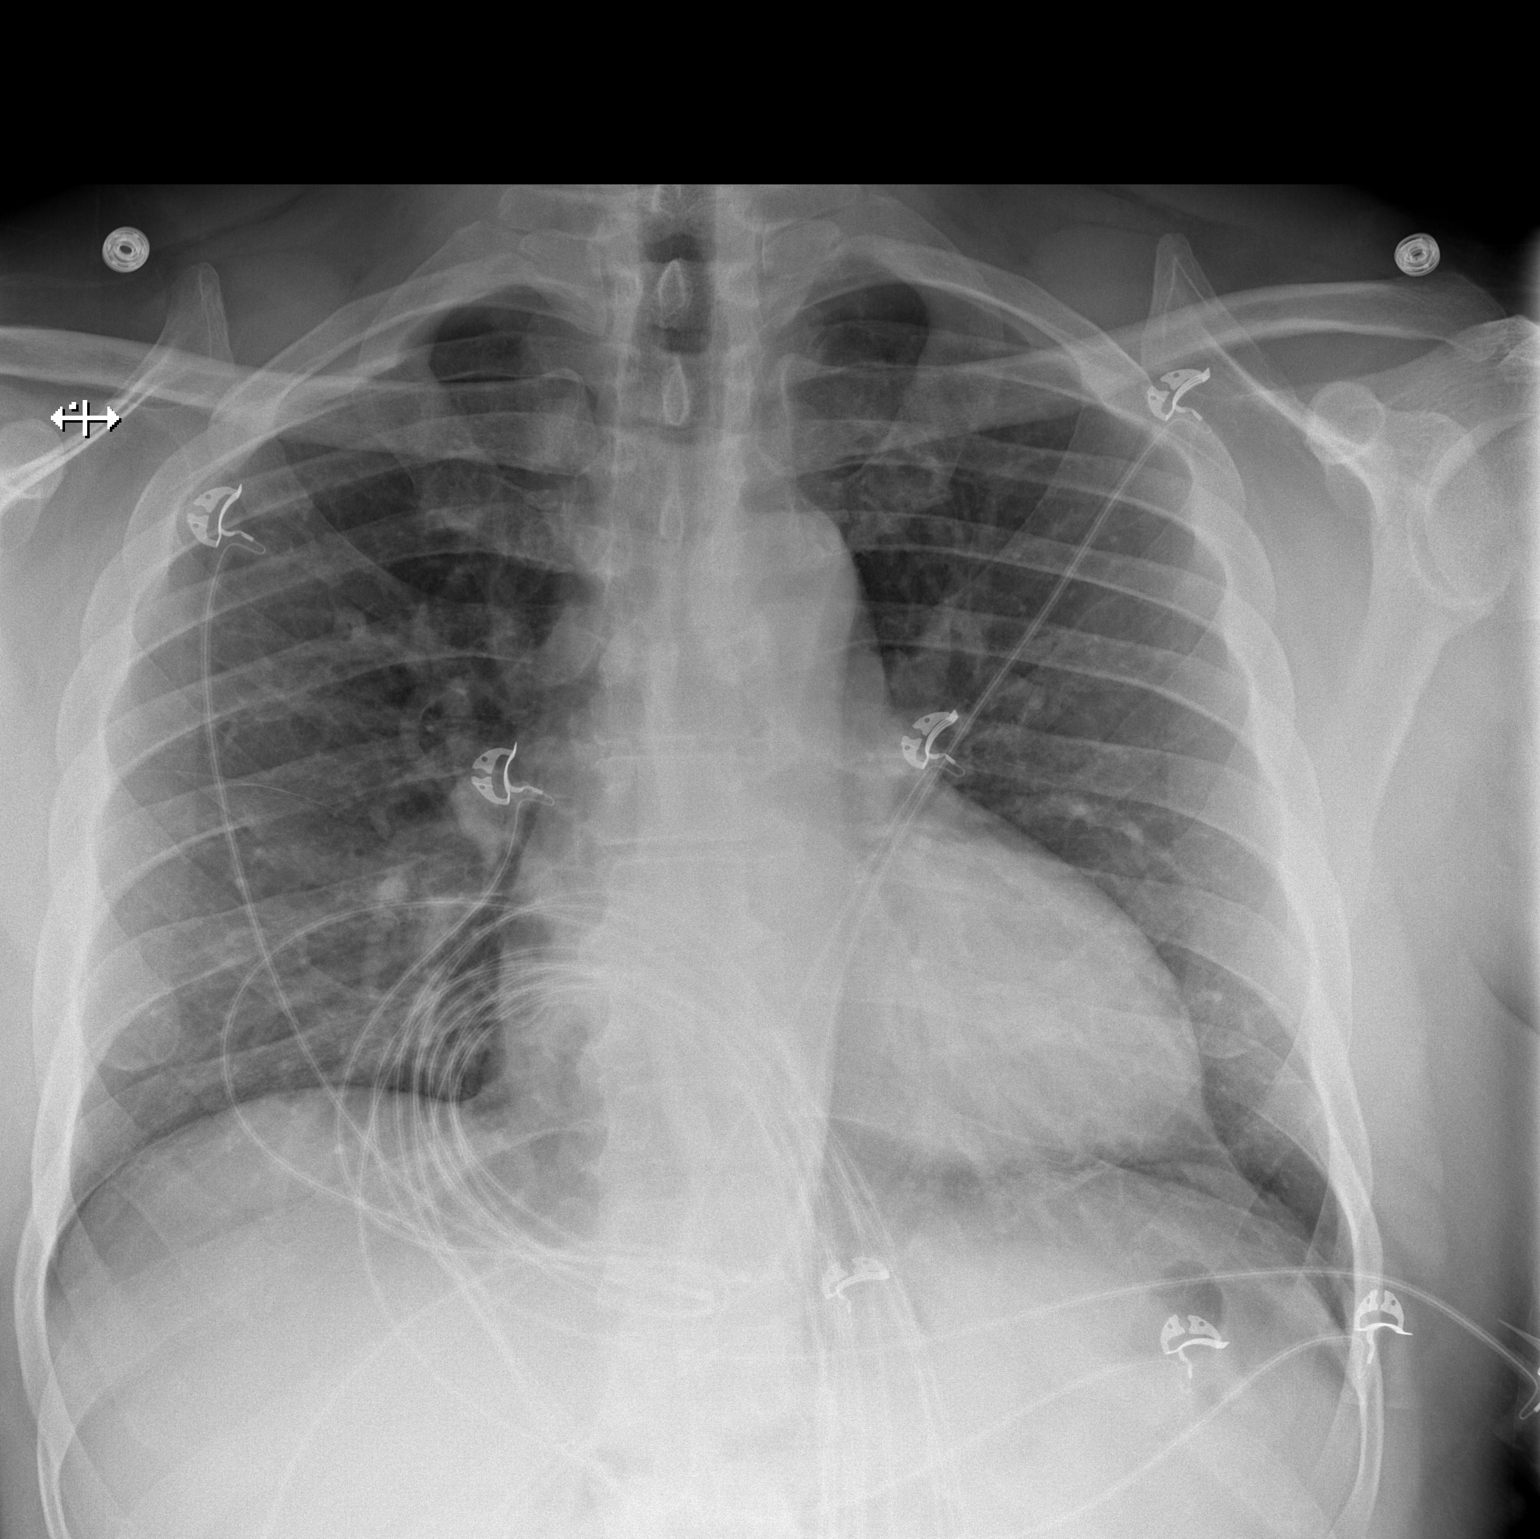

[w chest lat]
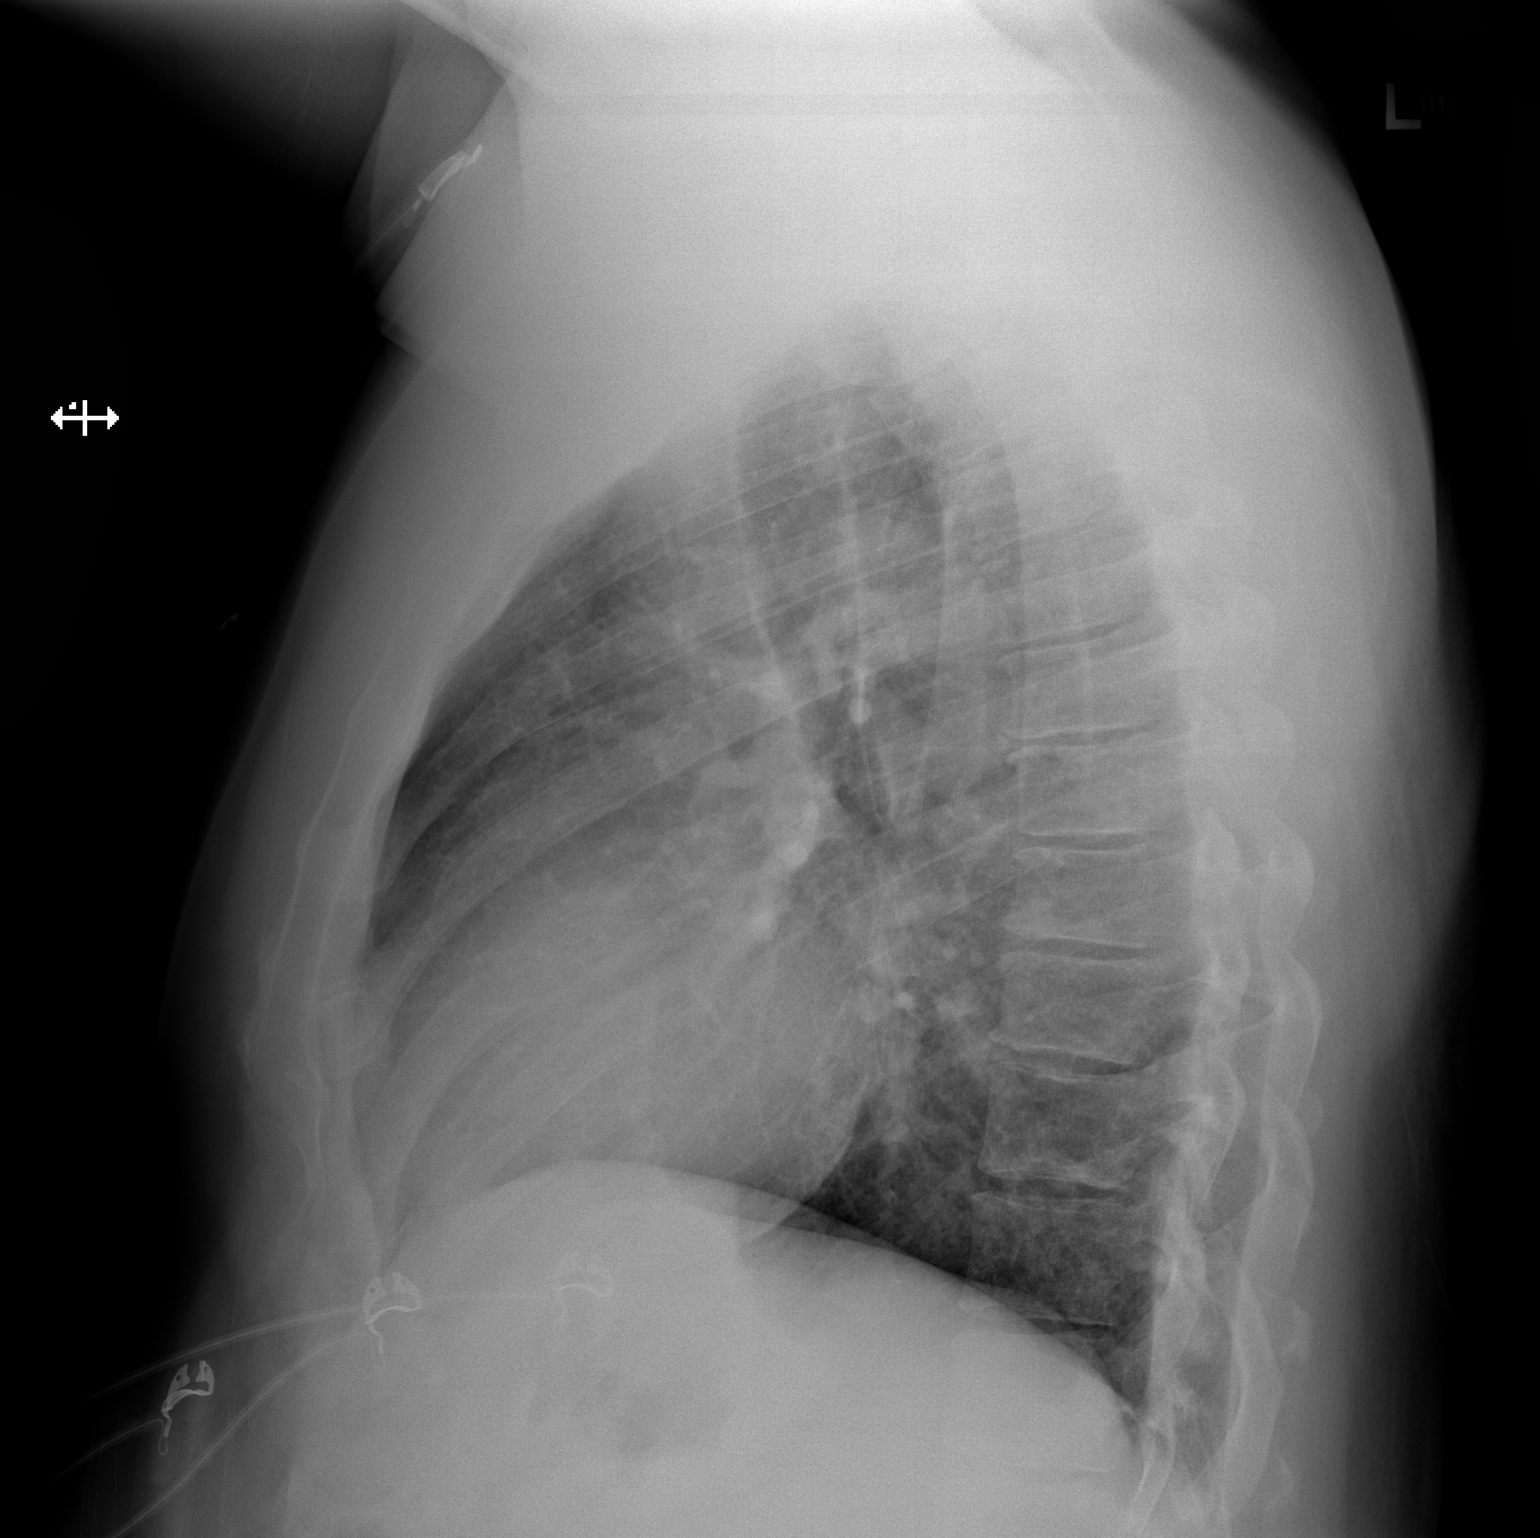

[2 of 2 positions shown; findings below may reference images not displayed]

FINDINGS: The cardiac silhouette is borderline to mildly enlarged. No airspace
consolidation, edema, pleural effusion, or pneumothorax is
identified. No acute osseous abnormality is seen.
IMPRESSION: No active cardiopulmonary disease.
